# Patient Record
Sex: Male | Born: 1980 | Race: White | Hispanic: No | Marital: Single | Smoking: Former smoker
Health system: Southern US, Community
[De-identification: ages and names within clinical notes are randomized; demographics above are authoritative.]

## PROBLEM LIST (undated history)

## (undated) DIAGNOSIS — I340 Nonrheumatic mitral (valve) insufficiency: Secondary | ICD-10-CM

## (undated) DIAGNOSIS — I38 Endocarditis, valve unspecified: Secondary | ICD-10-CM

## (undated) DIAGNOSIS — Z209 Contact with and (suspected) exposure to unspecified communicable disease: Secondary | ICD-10-CM

## (undated) DIAGNOSIS — R011 Cardiac murmur, unspecified: Secondary | ICD-10-CM

## (undated) DIAGNOSIS — R0609 Other forms of dyspnea: Secondary | ICD-10-CM

## (undated) DIAGNOSIS — I351 Nonrheumatic aortic (valve) insufficiency: Secondary | ICD-10-CM

## (undated) HISTORY — PX: HERNIA REPAIR: SHX51

---

## 2012-05-21 ENCOUNTER — Encounter (HOSPITAL_COMMUNITY): Payer: Self-pay | Admitting: Emergency Medicine

## 2012-05-21 ENCOUNTER — Emergency Department (HOSPITAL_COMMUNITY)
Admission: EM | Admit: 2012-05-21 | Discharge: 2012-05-22 | Disposition: A | Discharge status: Home / Self Care | Payer: BC Managed Care – PPO | Attending: Emergency Medicine | Admitting: Emergency Medicine

## 2012-05-21 DIAGNOSIS — Z203 Contact with and (suspected) exposure to rabies: Secondary | ICD-10-CM

## 2012-05-21 MED ORDER — RABIES VACCINE, PCEC IM SUSR
1.0000 mL | Freq: Once | INTRAMUSCULAR | Status: AC
  Administered 2012-05-22: 1 mL via INTRAMUSCULAR
  Filled 2012-05-21: qty 1

## 2012-05-21 MED ORDER — RABIES IMMUNE GLOBULIN 150 UNIT/ML IM INJ
20.0000 [IU]/kg | INJECTION | Freq: Once | INTRAMUSCULAR | Status: AC
  Administered 2012-05-22: 1500 [IU] via INTRAMUSCULAR
  Filled 2012-05-21: qty 10

## 2012-05-21 NOTE — ED Notes (Signed)
PT. REPORTS FOUND A BAT INSIDE HIS ROOM , UNSURE IF HE IS BITTEN . NO OTHER COMPLAINTS OR SYMPTOMS.

## 2012-05-21 NOTE — ED Provider Notes (Signed)
History     CSN: [id number]  Arrival date & time 05/21/12  2204   None     No chief complaint on file.   (Consider location/radiation/quality/duration/timing/severity/associated sxs/prior treatment) HPI Comments: Last night, while sleeping he thought he was having a dream and kicked about off his foot and [place] in the corner of his room, went back to sleep, upon waking in the morning.  He found a bat in his shower.  He skipped it up with a dust pan and throughout the window, stating it looked sick  The history is provided by the patient.    History reviewed. No pertinent past medical history.  History reviewed. No pertinent past surgical history.  No family history on file.  History  Substance Use Topics  . Smoking status: Not on file  . Smokeless tobacco: Not on file  . Alcohol Use: Not on file      Review of Systems  Constitutional: Negative for fever.  Neurological: Negative for dizziness and weakness.  Psychiatric/Behavioral: The patient is nervous/anxious.     Allergies  Review of patient's allergies indicates no known allergies.  Home Medications  No current outpatient prescriptions on file.  BP 133/49  Pulse 92  Temp 98.4 F (36.9 C) (Oral)  Resp 14  Ht 5\' 6"  (1.676 m)  Wt 167 lb 12.8 oz (76.114 kg)  BMI 27.08 kg/m2  SpO2 98%  Physical Exam  Constitutional: He appears well-developed and well-nourished.  HENT:  Head: Normocephalic.  Neck: Normal range of motion.  Cardiovascular: Normal rate.   Pulmonary/Chest: Effort normal.  Skin:       Noted in the skin noted    ED Course  Procedures (including critical care time)  Labs Reviewed - No data to display No results found.   1. Rabies exposure       MDM   Will start rabies series        Arman Filter, NP 05/22/12 0042  Arman Filter, NP 05/22/12 [phone]

## 2012-05-22 NOTE — Discharge Instructions (Signed)
Rabies   You may have been exposed to rabies. It can be carried by skunks, bats, woodchucks, raccoons and foxes. It is less common in dogs; however this is one of the most common bites for which the rabies vaccine is given. When bitten by an infected animal, a person gets rabies from the infected saliva (spit) of the animal. Most people get sick 20 to 90 days after being bitten. This varies based on the location of the bite. The rabies virus affects the brain so the closer to the head the bite occurs, the less time it will take the illness to develop. Once rabies develops it almost always causes death. Because of this, it is often necessary to start treatment whether it is known if the animal is healthy or not.  If bitten by an animal and the animal can be observed to see if it remains healthy, often no further treatment is necessary other than care of the wounds caused by the animal. If the animal has been killed it can be sent into a state laboratory and the brain can be examined to see if the animal had rabies.  TREATMENT   There is no known treatment for rabies once the disease starts. This is a viral illness and antibiotics (medications which kill germs) do not help. This is why caregivers use extra caution and treat questionable bites with rabies vaccine to prevent the disease.  RABIES IMMUNIZATION SCHEDULE - POST-EXPOSURE  Be sure to record the dates of your injections for your records.  1st Injection Date________________________  Day 3__________________________________  Day 7__________________________________  Day 14_________________________________  HOME CARE INSTRUCTIONS   If you have received a bite from an unknown animal, make sure you know the instructions for follow up. If the animal was sent to a laboratory for examination, make sure you know how you are to obtain your results.   Keep wound clean, dry and dressed as suggested by your caregiver.   Take antibiotics as directed and finish the  prescription, even if the wound appears OK.   Keep injured part elevated as much as possible.   Do not resume use of the affected area until directed.   Only take over-the-counter or prescription medicines for pain, discomfort, or fever as directed by your caregiver.  SEEK IMMEDIATE MEDICAL CARE IF:    You have a fever.   There is nausea (feeling sick to your stomach), vomiting, chills or a high temperature.   There is unusual behavior including hyperactivity, fear of water (hydrophobia), or fear of air (aerophobia).   If pain prevents movement of any joint.   If you are unable to move a finger or toe.   The wound becomes more inflamed and swollen, or has a purulent (pus-like) discharge.   You notice that there is a foreign substance, such as cloth or a tooth, in the wound.   If a red line develops at the site of the wound and begins to move up the arm or leg.  Document Released: 11/10/2005 Document Revised: 10/30/2011 Document Reviewed: 02/15/2009  Exit[employer] Patient Information 2012 [employer].

## 2012-05-22 NOTE — ED Provider Notes (Signed)
Medical screening examination/treatment/procedure(s) were performed by non-physician practitioner and as supervising physician I was immediately available for consultation/collaboration.   Offie Waide, MD 05/22/12 0706 

## 2012-05-24 ENCOUNTER — Encounter (HOSPITAL_COMMUNITY): Payer: Self-pay | Admitting: *Deleted

## 2012-05-24 ENCOUNTER — Telehealth (HOSPITAL_COMMUNITY): Payer: Self-pay | Admitting: *Deleted

## 2012-05-24 ENCOUNTER — Emergency Department (INDEPENDENT_AMBULATORY_CARE_PROVIDER_SITE_OTHER)
Admission: EM | Admit: 2012-05-24 | Discharge: 2012-05-24 | Disposition: A | Discharge status: Home / Self Care | Payer: BC Managed Care – PPO | Source: Home / Self Care

## 2012-05-24 DIAGNOSIS — Z23 Encounter for immunization: Secondary | ICD-10-CM

## 2012-05-24 MED ORDER — RABIES VACCINE, PCEC IM SUSR
INTRAMUSCULAR | Status: AC
  Filled 2012-05-24: qty 1

## 2012-05-24 MED ORDER — RABIES VACCINE, PCEC IM SUSR
1.0000 mL | Freq: Once | INTRAMUSCULAR | Status: AC
  Administered 2012-05-24: 1 mL via INTRAMUSCULAR

## 2012-05-24 NOTE — ED Notes (Signed)
Here for 2nd rabies vaccine for bat exposure.  No previous problems with injections.

## 2012-05-24 NOTE — ED Notes (Signed)
Rabies schedule completed and faxed to [facility] pharmacy x 2.

## 2012-05-24 NOTE — ED Notes (Signed)
Rabies vaccines scheduled for today @ 1630, 7/5 @ 1230 and 7/12 @ 1530.

## 2012-05-24 NOTE — Discharge Instructions (Signed)
Call if any problems.  Return 7/5 @ 1230 for next vaccine.

## 2012-05-24 NOTE — ED Notes (Signed)
Here for second rabies vaccine for bat exposure.

## 2012-05-24 NOTE — ED Notes (Signed)
Call from pt.'s personal assistant to schedule his rabies vaccines for bat exposure.  He is supposed to come today, 7/5 and 7/12. I offered times for him to come he said he would check with him and call me back. Vassie Moselle 05/24/2012

## 2012-05-28 ENCOUNTER — Encounter (HOSPITAL_COMMUNITY): Payer: Self-pay | Admitting: *Deleted

## 2012-05-28 ENCOUNTER — Emergency Department (INDEPENDENT_AMBULATORY_CARE_PROVIDER_SITE_OTHER)
Admission: EM | Admit: 2012-05-28 | Discharge: 2012-05-28 | Disposition: A | Discharge status: Home / Self Care | Payer: BC Managed Care – PPO | Source: Home / Self Care

## 2012-05-28 DIAGNOSIS — Z23 Encounter for immunization: Secondary | ICD-10-CM

## 2012-05-28 MED ORDER — RABIES VACCINE, PCEC IM SUSR
1.0000 mL | Freq: Once | INTRAMUSCULAR | Status: AC
  Administered 2012-05-28: 1 mL via INTRAMUSCULAR

## 2012-05-28 MED ORDER — RABIES VACCINE, PCEC IM SUSR
INTRAMUSCULAR | Status: AC
  Filled 2012-05-28: qty 1

## 2012-05-28 NOTE — ED Notes (Signed)
Here for third rabies vaccine for bat exposure.  C/o fever and fatigue. He asked if it was from the rabies shots. I told him it might be.

## 2012-05-31 ENCOUNTER — Telehealth (HOSPITAL_COMMUNITY): Payer: Self-pay | Admitting: *Deleted

## 2012-05-31 NOTE — ED Notes (Signed)
Pt. called and said he is still having a fever and has been taking Tylenol every 6 hrs. I did not think side effects from rabies shot would last over 24-48 hrs, but told him it may not be related to the rabies shot he received on Fri.  C/o numbness and tingling in his arms and fingers intermitantly.  Also c/o bruise on the pad of his long finger with swelling and pain- no known injury.  Discussed with Dr. Chaney Malling and she said he needs to come and be checked.  Can't make a diagnosis with symptoms given over the phone. Pt. told this. He said he will watch it tonight and come in AM if no improvement. Vassie Moselle 05/31/2012

## 2012-06-04 ENCOUNTER — Emergency Department (INDEPENDENT_AMBULATORY_CARE_PROVIDER_SITE_OTHER)
Admission: EM | Admit: 2012-06-04 | Discharge: 2012-06-04 | Disposition: A | Discharge status: Home / Self Care | Payer: BC Managed Care – PPO | Source: Home / Self Care

## 2012-06-04 ENCOUNTER — Encounter (HOSPITAL_COMMUNITY): Payer: Self-pay | Admitting: *Deleted

## 2012-06-04 DIAGNOSIS — Z23 Encounter for immunization: Secondary | ICD-10-CM

## 2012-06-04 MED ORDER — RABIES VACCINE, PCEC IM SUSR
1.0000 mL | Freq: Once | INTRAMUSCULAR | Status: AC
  Administered 2012-06-04: 1 mL via INTRAMUSCULAR

## 2012-06-04 NOTE — ED Notes (Signed)
Here for last rabies vaccine for bat exposure.  Had fever for 48 hrs after last vaccination.

## 2012-06-08 ENCOUNTER — Ambulatory Visit
Admission: RE | Admit: 2012-06-08 | Discharge: 2012-06-08 | Disposition: A | Discharge status: Home / Self Care | Payer: BC Managed Care – PPO | Source: Ambulatory Visit | Attending: Physician Assistant | Admitting: Physician Assistant

## 2012-06-08 ENCOUNTER — Other Ambulatory Visit: Payer: Self-pay | Admitting: Physician Assistant

## 2012-06-08 DIAGNOSIS — R05 Cough: Secondary | ICD-10-CM

## 2012-06-08 DIAGNOSIS — R059 Cough, unspecified: Secondary | ICD-10-CM

## 2012-06-23 ENCOUNTER — Other Ambulatory Visit: Payer: Self-pay | Admitting: Interventional Cardiology

## 2012-06-24 ENCOUNTER — Inpatient Hospital Stay (HOSPITAL_COMMUNITY)
Admission: AD | Admit: 2012-06-24 | Discharge: 2012-06-29 | DRG: 126 | Payer: BC Managed Care – PPO | Source: Ambulatory Visit | Attending: Family Medicine | Admitting: Family Medicine

## 2012-06-24 ENCOUNTER — Ambulatory Visit (INDEPENDENT_AMBULATORY_CARE_PROVIDER_SITE_OTHER): Payer: BC Managed Care – PPO | Admitting: Internal Medicine

## 2012-06-24 ENCOUNTER — Encounter: Payer: Self-pay | Admitting: *Deleted

## 2012-06-24 ENCOUNTER — Encounter: Payer: Self-pay | Admitting: Internal Medicine

## 2012-06-24 ENCOUNTER — Encounter (HOSPITAL_COMMUNITY): Payer: Self-pay | Admitting: Internal Medicine

## 2012-06-24 VITALS — BP 131/63 | HR 114 | Temp 98.5°F | Wt 159.0 lb

## 2012-06-24 DIAGNOSIS — R0609 Other forms of dyspnea: Secondary | ICD-10-CM

## 2012-06-24 DIAGNOSIS — B952 Enterococcus as the cause of diseases classified elsewhere: Secondary | ICD-10-CM | POA: Diagnosis present

## 2012-06-24 DIAGNOSIS — Z87891 Personal history of nicotine dependence: Secondary | ICD-10-CM

## 2012-06-24 DIAGNOSIS — I38 Endocarditis, valve unspecified: Secondary | ICD-10-CM

## 2012-06-24 DIAGNOSIS — E8809 Other disorders of plasma-protein metabolism, not elsewhere classified: Secondary | ICD-10-CM | POA: Diagnosis present

## 2012-06-24 DIAGNOSIS — Z8249 Family history of ischemic heart disease and other diseases of the circulatory system: Secondary | ICD-10-CM

## 2012-06-24 DIAGNOSIS — R011 Cardiac murmur, unspecified: Secondary | ICD-10-CM

## 2012-06-24 DIAGNOSIS — I340 Nonrheumatic mitral (valve) insufficiency: Secondary | ICD-10-CM

## 2012-06-24 DIAGNOSIS — Q231 Congenital insufficiency of aortic valve: Secondary | ICD-10-CM

## 2012-06-24 DIAGNOSIS — R0602 Shortness of breath: Secondary | ICD-10-CM

## 2012-06-24 DIAGNOSIS — R000 Tachycardia, unspecified: Secondary | ICD-10-CM | POA: Diagnosis present

## 2012-06-24 DIAGNOSIS — E8779 Other fluid overload: Secondary | ICD-10-CM | POA: Diagnosis present

## 2012-06-24 DIAGNOSIS — I509 Heart failure, unspecified: Secondary | ICD-10-CM | POA: Diagnosis present

## 2012-06-24 DIAGNOSIS — I351 Nonrheumatic aortic (valve) insufficiency: Secondary | ICD-10-CM

## 2012-06-24 DIAGNOSIS — B955 Unspecified streptococcus as the cause of diseases classified elsewhere: Secondary | ICD-10-CM

## 2012-06-24 DIAGNOSIS — I5021 Acute systolic (congestive) heart failure: Secondary | ICD-10-CM | POA: Diagnosis present

## 2012-06-24 DIAGNOSIS — I330 Acute and subacute infective endocarditis: Principal | ICD-10-CM | POA: Diagnosis present

## 2012-06-24 DIAGNOSIS — R0600 Dyspnea, unspecified: Secondary | ICD-10-CM

## 2012-06-24 HISTORY — DX: Other forms of dyspnea: R06.09

## 2012-06-24 HISTORY — DX: Nonrheumatic mitral (valve) insufficiency: I34.0

## 2012-06-24 HISTORY — DX: Dyspnea, unspecified: R06.00

## 2012-06-24 HISTORY — DX: Endocarditis, valve unspecified: I38

## 2012-06-24 HISTORY — DX: Contact with and (suspected) exposure to unspecified communicable disease: Z20.9

## 2012-06-24 HISTORY — DX: Nonrheumatic aortic (valve) insufficiency: I35.1

## 2012-06-24 HISTORY — DX: Cardiac murmur, unspecified: R01.1

## 2012-06-24 LAB — CBC WITH DIFFERENTIAL/PLATELET
Basophils Absolute: 0.1 10*3/uL (ref 0.0–0.1)
Basophils Relative: 1 % (ref 0–1)
Eosinophils Absolute: 0.1 10*3/uL (ref 0.0–0.7)
Eosinophils Relative: 1 % (ref 0–5)
HCT: 33.6 % — ABNORMAL LOW (ref 39.0–52.0)
Hemoglobin: 10.9 g/dL — ABNORMAL LOW (ref 13.0–17.0)
Lymphocytes Relative: 14 % (ref 12–46)
Lymphs Abs: 1.3 10*3/uL (ref 0.7–4.0)
MCH: 26.2 pg (ref 26.0–34.0)
MCHC: 32.4 g/dL (ref 30.0–36.0)
MCV: 80.8 fL (ref 78.0–100.0)
Monocytes Absolute: 0.7 10*3/uL (ref 0.1–1.0)
Monocytes Relative: 8 % (ref 3–12)
Neutro Abs: 7.2 10*3/uL (ref 1.7–7.7)
Neutrophils Relative %: 76 % (ref 43–77)
Platelets: 434 10*3/uL — ABNORMAL HIGH (ref 150–400)
RBC: 4.16 MIL/uL — ABNORMAL LOW (ref 4.22–5.81)
RDW: 13.7 % (ref 11.5–15.5)
WBC: 9.4 10*3/uL (ref 4.0–10.5)

## 2012-06-24 MED ORDER — ACETAMINOPHEN 325 MG PO TABS
650.0000 mg | ORAL_TABLET | Freq: Four times a day (QID) | ORAL | Status: DC | PRN
Start: 2012-06-24 — End: 2012-06-29

## 2012-06-24 MED ORDER — FUROSEMIDE 20 MG PO TABS
20.0000 mg | ORAL_TABLET | Freq: Every day | ORAL | Status: DC
  Administered 2012-06-25: 20 mg via ORAL
  Filled 2012-06-24: qty 1

## 2012-06-24 MED ORDER — SODIUM CHLORIDE 0.9 % IJ SOLN
3.0000 mL | Freq: Two times a day (BID) | INTRAMUSCULAR | Status: DC
  Administered 2012-06-25 – 2012-06-28 (×8): 3 mL via INTRAVENOUS

## 2012-06-24 MED ORDER — ACETAMINOPHEN 650 MG RE SUPP: 650.0000 mg | Freq: Four times a day (QID) | RECTAL | Status: DC | PRN

## 2012-06-24 MED ORDER — SODIUM CHLORIDE 0.9 % IJ SOLN: 3.0000 mL | Freq: Two times a day (BID) | INTRAMUSCULAR | Status: DC

## 2012-06-24 MED ORDER — ONDANSETRON HCL 4 MG PO TABS: 4.0000 mg | ORAL_TABLET | Freq: Four times a day (QID) | ORAL | Status: DC | PRN

## 2012-06-24 MED ORDER — SODIUM CHLORIDE 0.9 % IV SOLN
2.0000 g | INTRAVENOUS | Status: DC
  Administered 2012-06-24 – 2012-06-25 (×4): 2 g via INTRAVENOUS
  Filled 2012-06-24 (×9): qty 2000

## 2012-06-24 MED ORDER — SODIUM CHLORIDE 0.9 % IJ SOLN
3.0000 mL | Freq: Two times a day (BID) | INTRAMUSCULAR | Status: DC
  Administered 2012-06-24: 3 mL via INTRAVENOUS

## 2012-06-24 MED ORDER — VANCOMYCIN HCL IN DEXTROSE 1-5 GM/200ML-% IV SOLN
1000.0000 mg | Freq: Three times a day (TID) | INTRAVENOUS | Status: DC
  Administered 2012-06-25 – 2012-06-26 (×5): 1000 mg via INTRAVENOUS
  Filled 2012-06-24 (×6): qty 200

## 2012-06-24 MED ORDER — SODIUM CHLORIDE 0.45 % IV SOLN: INTRAVENOUS | Status: DC

## 2012-06-24 MED ORDER — ONDANSETRON HCL 4 MG/2ML IJ SOLN: 4.0000 mg | Freq: Four times a day (QID) | INTRAMUSCULAR | Status: DC | PRN

## 2012-06-24 MED ORDER — GENTAMICIN SULFATE 40 MG/ML IJ SOLN
70.0000 mg | Freq: Three times a day (TID) | INTRAVENOUS | Status: DC
  Administered 2012-06-25 (×2): 70 mg via INTRAVENOUS
  Filled 2012-06-24 (×3): qty 1.75

## 2012-06-24 MED ORDER — SODIUM CHLORIDE 0.9 % IJ SOLN: 3.0000 mL | INTRAMUSCULAR | Status: DC | PRN

## 2012-06-24 MED ORDER — SODIUM CHLORIDE 0.9 % IV SOLN: 250.0000 mL | INTRAVENOUS | Status: DC | PRN

## 2012-06-24 NOTE — Progress Notes (Signed)
ANTIBIOTIC CONSULT NOTE - INITIAL  Pharmacy Consult for vancomycin and gentamicin  Indication: native valve Enterococcus endocarditis  No Known Allergies  Patient Measurements: Height: 5\' 6"  (167.6 cm) Weight: 159 lb (72.122 kg) IBW/kg (Calculated) : 63.8   Vital Signs: Temp: 98.3 F (36.8 C) (08/01 2100) Temp src: Oral (08/01 2100) BP: 128/56 mmHg (08/01 2100) Pulse Rate: 107  (08/01 2100) Intake/Output from previous day:   Intake/Output from this shift:    Labs:  Basename 06/24/12 1524  WBC 9.4  HGB 10.9*  PLT 434*  LABCREA --  CREATININE --   CrCl is unknown because no creatinine reading has been taken. No results found for this basename: VANCOTROUGH:2,VANCOPEAK:2,VANCORANDOM:2,GENTTROUGH:2,GENTPEAK:2,GENTRANDOM:2,TOBRATROUGH:2,TOBRAPEAK:2,TOBRARND:2,AMIKACINPEAK:2,AMIKACINTROU:2,AMIKACIN:2, in the last 72 hours   Microbiology: No results found for this or any previous visit (from the past 720 hour(s)).  Medical History: Past Medical History  Diagnosis Date  . No pertinent past medical history     Medications:  Prescriptions prior to admission  Medication Sig Dispense Refill  . beclomethasone (QVAR) 40 MCG/ACT inhaler Inhale 1 puff into the lungs 2 (two) times daily.      . furosemide (LASIX) 20 MG tablet Take 20 mg by mouth daily.       Assessment: 31 yo male with new onset heart murmur at outpatient office visit. TTE showed bicupsid AV but mild to mod MR, severe AR and a defect in the noncoronary cusp of AV concerning for tear. Blood cultures are positive for Enterococcus, sensitivities pending. Pharmacy consulted to begin vancomycin and gentamicin for native valve endocarditis. ID consulted and recommends 6 weeks of antibiotics. Baseline labs pending.  Goal of Therapy:  Vancomycin trough level 15-20 mcg/ml  Plan:  -Vancomycin 1000 mg IV q8h -Gentamicin 70 mg IV q8h -Follow up culture data and clinical progress -Follow-up baseline labs and adjust  antibiotics if needed   [facility] [place].D., BCPS Clinical Pharmacist Pager: [phone] 06/24/2012 10:54 PM

## 2012-06-24 NOTE — H&P (Signed)
Austin Charles is an 31 y.o. male.   Patient was seen and examined on June 24, 2012 at 10:30 PM. PCP - Dr. Daleen Squibb of [facility]. Cardiologist - Dr. Eldridge Dace. Please see Dr. Drue Second, infectious disease consultants note dictated today. Chief Complaint: Positive blood cultures. HPI: 31 year old male with no significant past medical history was experiencing chronic cough for last 6 months since he had a flulike illness in February this year. At that time he also had deep cleaning of his teeth. He recent bat exposure and had undergone complete series of rabies vaccination recently in July 2013. During dose injections he used to get fever and chills for 2 days after each dose of injection. Patient since his cough is persistent he had gone to an urgent care at [facility]. There he was prescribed steroid inhaler. Despite taking which his cough did not improve and also had shortness of breath which progressed and worsened and his cough had blood-tinged sputum. At this point he was referred to cardiologist Dr. Eldridge Dace. On physical exam he was found to have abnormal heart murmur and S3. Patient had 2-D echo done which showed bicuspid aortic valve and severe aortic regurgitation. Patient had blood cultures drawn at that time. Cultures grew enterococcus and was referred to infectious disease consult. Patient by the time the time short of breath even at rest. At this time patient has been referred to hospitalist for further workup for his infective endocarditis by the infectious disease consultant Dr. Drue Second. Patient denies any chest pain or nausea vomiting headache visual symptoms abdominal pain or any joint swellings. Patient presently is not in acute distress. He did experience some painful swelling of his left middle and ring finger. Which improved and resolved this happened last week. Yesterday he did develop some painful fingers in the right index finger which also has resolved.  Past  Medical History  Diagnosis Date  . No pertinent past medical history     Past Surgical History  Procedure Date  . No past surgeries     Family History  Problem Relation Age of Onset  . Coronary artery disease Other    Social History:  reports that he has never smoked. He does not have any smokeless tobacco history on file. He reports that he drinks alcohol. He reports that he does not use illicit drugs.  Allergies: No Known Allergies  Medications Prior to Admission  Medication Sig Dispense Refill  . beclomethasone (QVAR) 40 MCG/ACT inhaler Inhale 1 puff into the lungs 2 (two) times daily.      . furosemide (LASIX) 20 MG tablet Take 20 mg by mouth daily.        No results found for this or any previous visit (from the past 48 hour(s)). No results found.  Review of Systems  Constitutional: Positive for fever and chills.  HENT: Negative.   Eyes: Negative.   Respiratory: Positive for shortness of breath.   Cardiovascular: Negative.   Gastrointestinal: Negative.   Genitourinary: Negative.   Musculoskeletal:       Pain in the finger tips.  Skin: Negative.   Neurological: Negative.   Endo/Heme/Allergies: Negative.   Psychiatric/Behavioral: Negative.     Blood pressure 128/56, pulse 107, temperature 98.3 F (36.8 C), temperature source Oral, height 5\' 6"  (1.676 m), weight 72.122 kg (159 lb), SpO2 99.00%. Physical Exam  Constitutional: He is oriented to person, place, and time. He appears well-developed and well-nourished. No distress.  HENT:  Head: Atraumatic.  Right Ear: External  ear normal.  Left Ear: External ear normal.  Nose: Nose normal.  Mouth/Throat: Oropharynx is clear and moist. No oropharyngeal exudate.  Eyes: Conjunctivae are normal. Pupils are equal, round, and reactive to light. Right eye exhibits no discharge. Left eye exhibits no discharge. No scleral icterus.  Neck: Normal range of motion. Neck supple.  Cardiovascular: Regular rhythm.   Murmur heard.       Tachycardia.  Respiratory: Effort normal and breath sounds normal. No respiratory distress. He has no wheezes. He has no rales.  GI: Soft. Bowel sounds are normal.  Musculoskeletal: Normal range of motion. He exhibits no edema and no tenderness.  Neurological: He is alert and oriented to person, place, and time.       Moves all extremities.  Skin: Skin is warm and dry. He is not diaphoretic.  Psychiatric: His behavior is normal.     Assessment/Plan #1. 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 Stanford Breed recommendations patient has been started on vancomycin gentamicin and ampicillin to cover for endocarditis. Repeat blood cultures has been ordered. I also discussed with on-call cardiologist Dr. Shirlee Latch. At this time we will be continuing his Lasix as prescribed by Dr. Eldridge Dace earlier. Patient is to get a TEE tomorrow. Patient does have sinus tachycardia at this time closely monitor in telemetry. We'll need to notify Dr. Eldridge Dace in a.m. Further recommendations per infectious disease and cardiology.  CODE STATUS - full code.  Austin Charles N. 06/24/2012, 10:30 PM

## 2012-06-24 NOTE — Progress Notes (Signed)
INFECTIOUS DISEASES CLINIC  RFV: community referral for native valve endocarditis  Subjective:    Patient ID: Austin Charles, male    DOB: June 04, 1981, 31 y.o.   MRN: [id number]  HPI Austin Charles is a 31yo Male with no significant past medical history, no hx of childhood murmurs or congenital heart disease. He reports having a flu like illness in Feb 2013 with fever, sore throat, cough, malaise that lasted roughly 1 week but states he has had a persistent dry cough since then. He recalls having some dental work around this same time period, where he had a deep cleaning in feb/march 2013.  Roughly 1 month ago, he had a bat exposure at his home, unclear if he had bite/scratch but did seek immediate medical attention in the ED where he underwent rabies vaccination series. He states that he would get fevers for 2 days after each injection. Over the past 4 wk, he is having increasing dyspnea on exertion, lethargy, but no orthopnea. He states he has had occassional nightsweats and fatigue but no overt fevers or rigors. He recalls having episodes of spot tenderness to finger pad of his 2nd or 3rd digit, which he is not sure if this is related. He states his fatigue is slightly improved but has had 5-7 lb weight loss from his baseline.  In regards to his chronic cough, he had received an inhaler from urgent care but the noticed having blood tinged sputum. He was seen by Dr. Eldridge Dace at [facility] cardiology who noted a new 2/6 murmur with an S3. EKG some signs of LVH. TTE showed bicupsid AV but mild to mod MR, severe AR nad a defect in the noncornary cusp of AV concerning for tear. He drew a set of blood cultures that recently identified GPCs. Thus patient referred to ID clinic, and patient to have TEE tomorrow on 8/2  Social hx: He denies any injection drug use, no cat scratches or exposure to animals. He has not had any recent travel. Did go to [place] 2 yrs ago, tourist, stayed in hotels, site-seeing mostly. He is a  Energy manager, doing dissertation.  Stopped smoking 1 yr ago. Rare etoh use. He lives in an older home, bottom floor. He is concerned about mold, but does not see evidence of it in kitchen or bathroom. 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 His family is in [place] where he hopes to travel to next week for roughly 7-10days.  Family hx: hyperlipidemia  Current Outpatient Prescriptions on File Prior to Visit  Medication Sig Dispense Refill  . beclomethasone (QVAR) 40 MCG/ACT inhaler Inhale 1 puff into the lungs 2 (two) times daily.       No Known Allergies Active Ambulatory Problems    Diagnosis Date Noted  . Undiagnosed cardiac murmurs 06/24/2012  . Shortness of breath 06/24/2012  . Endocarditis 06/24/2012   Resolved Ambulatory Problems    Diagnosis Date Noted  . No Resolved Ambulatory Problems   No Additional Past Medical History   Review of Systems 10 point review of system reviewed, positive pertinents in hpi. No n/v/d/rash/headache/arthralgias     Objective:   Physical Exam BP 131/63  Pulse 114  Temp 98.5 F (36.9 C) (Oral)  Wt 159 lb (72.122 kg)  General Appearance:    Alert, cooperative, no distress, appears stated age  Head:    Normocephalic, without obvious abnormality, atraumatic  Eyes:    PERRL, conjunctiva/corneas clear, EOM's intact, fundi    benign, both eyes  Ears:    Normal TM's and external ear canals, both ears  Nose:   Nares normal, septum midline, mucosa normal, no drainage   or sinus tenderness  Throat:   Lips, mucosa, and tongue normal; teeth and gums normal  Neck:   Supple, symmetrical, trachea midline, no adenopathy;         Back:     Symmetric, no curvature, ROM normal, no CVA tenderness  Lungs:     Clear to auscultation bilaterally, respirations unlabored     Heart:    Tachycardic, Regular rhythm, S1 and S2 normal, plus S3, 2 of 6 SEM BH at apex. no  rubor gallop  Abdomen:     Soft, non-tender, bowel sounds active all four  quadrants,    no masses, no organomegaly        Extremities:   Mild clubbing of nails (pt states that is his baseline), no janeway, oslers or splinter hemorrhages  Pulses:   2+ and symmetric all extremities  Skin:   Skin color, texture, turgor normal, no rashes or lesions  Lymph nodes:   Cervical, supraclavicular, and axillary nodes normal  Neurologic:   CNII-XII intact. Normal strength, sensation and reflexes      throughout   Historic micro: We called labcrop and blood cx -> enterococcus species. Susceptibilities are pending      Assessment & Plan:  31yo Male with new onset heart murmur as noted on physical exam, documented on TTE, and blood cultures positive for enterococcus. 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 He meets 2 major [facility] criteria, and possibly 1 minor with osler's nodes ( by history)  - will get cbc with diff, cmp, and repeat blood cultures to document if persistent bacteremia - will need to arrange for IV antibiotics, and have picc line placed once bacteremia has been cleared. - looking forward to hearing about TEE results tomorrow to see if any vegetations  - still recommend that the patient be also evaluated by CT surgery to see if needs valve replacement - await for identification of enterococcus species ( if e.faecalis (amp S) or vanco resistant e. Faecium)  - will need 6 wks of IV antibiotics ( if amp S enterococcus -> ampicillin 2gm Q4 hr IV plus gentamicin 1mg /kg/Q8hr ) but regimen may change depending on enterococcus susceptibility.  - will discuss recs with Dr. Eldridge Dace  to decide how to proceed with hospital admission.  [facility] Salvia Drue Second MD MPH [facility] [phone]

## 2012-06-25 ENCOUNTER — Encounter (HOSPITAL_COMMUNITY)
Admission: AD | Disposition: A | Discharge status: Other Acute Inpatient Hospital | Payer: Self-pay | Source: Ambulatory Visit | Attending: Family Medicine

## 2012-06-25 ENCOUNTER — Encounter (HOSPITAL_COMMUNITY): Payer: Self-pay | Admitting: *Deleted

## 2012-06-25 ENCOUNTER — Inpatient Hospital Stay (HOSPITAL_COMMUNITY): Payer: BC Managed Care – PPO

## 2012-06-25 DIAGNOSIS — I359 Nonrheumatic aortic valve disorder, unspecified: Secondary | ICD-10-CM

## 2012-06-25 DIAGNOSIS — I330 Acute and subacute infective endocarditis: Secondary | ICD-10-CM

## 2012-06-25 DIAGNOSIS — I351 Nonrheumatic aortic (valve) insufficiency: Secondary | ICD-10-CM | POA: Diagnosis present

## 2012-06-25 DIAGNOSIS — R011 Cardiac murmur, unspecified: Secondary | ICD-10-CM

## 2012-06-25 HISTORY — PX: TEE WITHOUT CARDIOVERSION: SHX5443

## 2012-06-25 LAB — COMPREHENSIVE METABOLIC PANEL
ALT: 32 U/L (ref 0–53)
ALT: 36 U/L (ref 0–53)
ALT: 37 U/L (ref 0–53)
AST: 27 U/L (ref 0–37)
Albumin: 2.6 g/dL — ABNORMAL LOW (ref 3.5–5.2)
Albumin: 2.4 g/dL — ABNORMAL LOW (ref 3.5–5.2)
Alkaline Phosphatase: 85 U/L (ref 39–117)
Alkaline Phosphatase: 92 U/L (ref 39–117)
BUN: 10 mg/dL (ref 6–23)
Calcium: 8.3 mg/dL — ABNORMAL LOW (ref 8.4–10.5)
Chloride: 101 mEq/L (ref 96–112)
Glucose, Bld: 119 mg/dL — ABNORMAL HIGH (ref 70–99)
Potassium: 3.9 mEq/L (ref 3.5–5.1)
Potassium: 4 mEq/L (ref 3.5–5.1)
Sodium: 136 mEq/L (ref 135–145)
Sodium: 137 mEq/L (ref 135–145)
Sodium: 137 mEq/L (ref 135–145)
Total Bilirubin: 0.5 mg/dL (ref 0.3–1.2)
Total Bilirubin: 0.7 mg/dL (ref 0.3–1.2)
Total Protein: 5.9 g/dL — ABNORMAL LOW (ref 6.0–8.3)
Total Protein: 6.2 g/dL (ref 6.0–8.3)
Total Protein: 6.2 g/dL (ref 6.0–8.3)

## 2012-06-25 LAB — CBC WITH DIFFERENTIAL/PLATELET
Basophils Absolute: 0 10*3/uL (ref 0.0–0.1)
Basophils Relative: 0 % (ref 0–1)
Basophils Relative: 0 % (ref 0–1)
Eosinophils Absolute: 0.1 10*3/uL (ref 0.0–0.7)
Eosinophils Relative: 1 % (ref 0–5)
Eosinophils Relative: 1 % (ref 0–5)
HCT: 31 % — ABNORMAL LOW (ref 39.0–52.0)
HCT: 32.4 % — ABNORMAL LOW (ref 39.0–52.0)
Hemoglobin: 10.1 g/dL — ABNORMAL LOW (ref 13.0–17.0)
MCH: 26.5 pg (ref 26.0–34.0)
MCHC: 32.6 g/dL (ref 30.0–36.0)
MCHC: 33 g/dL (ref 30.0–36.0)
MCV: 81.4 fL (ref 78.0–100.0)
MCV: 82 fL (ref 78.0–100.0)
Monocytes Absolute: 0.5 10*3/uL (ref 0.1–1.0)
Monocytes Absolute: 0.6 10*3/uL (ref 0.1–1.0)
Monocytes Relative: 6 % (ref 3–12)
Neutro Abs: 7.5 10*3/uL (ref 1.7–7.7)
Neutro Abs: 7.9 10*3/uL — ABNORMAL HIGH (ref 1.7–7.7)
Platelets: 358 10*3/uL (ref 150–400)
RDW: 13.2 % (ref 11.5–15.5)
WBC: 9.1 10*3/uL (ref 4.0–10.5)

## 2012-06-25 LAB — CARDIAC PANEL(CRET KIN+CKTOT+MB+TROPI)
Relative Index: INVALID (ref 0.0–2.5)
Total CK: 39 U/L (ref 7–232)

## 2012-06-25 LAB — GLUCOSE, CAPILLARY
Glucose-Capillary: 112 mg/dL — ABNORMAL HIGH (ref 70–99)
Glucose-Capillary: 114 mg/dL — ABNORMAL HIGH (ref 70–99)

## 2012-06-25 SURGERY — ECHOCARDIOGRAM, TRANSESOPHAGEAL
Anesthesia: Moderate Sedation

## 2012-06-25 MED ORDER — FENTANYL CITRATE 0.05 MG/ML IJ SOLN
INTRAMUSCULAR | Status: AC
  Filled 2012-06-25: qty 4

## 2012-06-25 MED ORDER — SODIUM CHLORIDE 0.9 % IJ SOLN: 3.0000 mL | Freq: Two times a day (BID) | INTRAMUSCULAR | Status: DC

## 2012-06-25 MED ORDER — FUROSEMIDE 40 MG PO TABS
40.0000 mg | ORAL_TABLET | Freq: Every day | ORAL | Status: DC
  Administered 2012-06-26 – 2012-06-28 (×3): 40 mg via ORAL
  Filled 2012-06-25 (×4): qty 1

## 2012-06-25 MED ORDER — DIPHENHYDRAMINE HCL 50 MG/ML IJ SOLN
INTRAMUSCULAR | Status: AC
  Filled 2012-06-25: qty 1

## 2012-06-25 MED ORDER — MIDAZOLAM HCL 10 MG/2ML IJ SOLN
INTRAMUSCULAR | Status: AC
  Filled 2012-06-25: qty 4

## 2012-06-25 MED ORDER — SODIUM CHLORIDE 0.45 % IV SOLN
INTRAVENOUS | Status: DC
  Administered 2012-06-25: 500 mL via INTRAVENOUS

## 2012-06-25 MED ORDER — FUROSEMIDE 10 MG/ML IJ SOLN
INTRAMUSCULAR | Status: AC
  Filled 2012-06-25: qty 4

## 2012-06-25 MED ORDER — SODIUM CHLORIDE 0.9 % IV SOLN: 250.0000 mL | INTRAVENOUS | Status: DC | PRN

## 2012-06-25 MED ORDER — SODIUM CHLORIDE 0.45 % IV SOLN: INTRAVENOUS | Status: DC

## 2012-06-25 MED ORDER — LIDOCAINE VISCOUS 2 % MT SOLN
OROMUCOSAL | Status: AC
  Filled 2012-06-25: qty 15

## 2012-06-25 MED ORDER — FUROSEMIDE 10 MG/ML IJ SOLN
20.0000 mg | Freq: Once | INTRAMUSCULAR | Status: AC
  Administered 2012-06-25: 20 mg via INTRAVENOUS

## 2012-06-25 MED ORDER — SODIUM CHLORIDE 0.9 % IJ SOLN: 3.0000 mL | INTRAMUSCULAR | Status: DC | PRN

## 2012-06-25 MED ORDER — LIDOCAINE VISCOUS 2 % MT SOLN
OROMUCOSAL | Status: DC | PRN
  Administered 2012-06-25: 10 mL via OROMUCOSAL

## 2012-06-25 MED ORDER — MIDAZOLAM HCL 10 MG/2ML IJ SOLN
INTRAMUSCULAR | Status: DC | PRN
  Administered 2012-06-25: 1 mg via INTRAVENOUS
  Administered 2012-06-25: 2 mg via INTRAVENOUS

## 2012-06-25 MED ORDER — FENTANYL CITRATE 0.05 MG/ML IJ SOLN
INTRAMUSCULAR | Status: DC | PRN
  Administered 2012-06-25 (×2): 25 ug via INTRAVENOUS

## 2012-06-25 MED ORDER — GENTAMICIN IN SALINE 1.6-0.9 MG/ML-% IV SOLN
80.0000 mg | Freq: Three times a day (TID) | INTRAVENOUS | Status: DC
  Administered 2012-06-25 – 2012-06-26 (×3): 80 mg via INTRAVENOUS
  Filled 2012-06-25 (×5): qty 50

## 2012-06-25 NOTE — Progress Notes (Addendum)
[facility] Progress Note  06/25/2012  Subjective: Pt was seen and examined this morning after speaking with Night NP coverage Cordelia Pen.  Pt says he felt a little better after receiving lasix 20 mg IV this morning but still appears SOB.  I ordered an additional 20 mg IV of lasix.  Will continue to monitor him closely.  Maryln Manuel, MD  Pager [phone]  Objective:  Vital signs in last 24 hours: Filed Vitals:   06/25/12 1006 06/25/12 1210 06/25/12 1228 06/25/12 1231  BP: 109/55 126/46  123/46  Pulse: 108     Temp: 98 F (36.7 C) 98.7 F (37.1 C)    TempSrc: Oral Oral    Resp:  35  56  Height:      Weight:      SpO2:  96% 98% 98%   Weight change:   Intake/Output Summary (Last 24 hours) at 06/25/12 1246 Last data filed at 06/25/12 0930  Gross per 24 hour  Intake      0 ml  Output    400 ml  Net   -400 ml   No results found for this basename: HGBA1C   Lab Results  Component Value Date   CREATININE 0.96 06/25/2012    Review of Systems As above, otherwise all reviewed and reported negative  Physical Exam General - awake, no distress, cooperative HEENT - NCAT, MMM Lungs - BBS, CTA CV - normal s1, s2 sounds Abd - soft, nondistended, no masses, nontender Ext - no C/C/E  Lab Results: Results for orders placed during the hospital encounter of 06/24/12 (from the past 24 hour(s))  COMPREHENSIVE METABOLIC PANEL     Status: Abnormal   Collection Time   06/25/12 12:12 AM      Component Value Range   Sodium 137  135 - 145 mEq/L   Potassium 3.9  3.5 - 5.1 mEq/L   Chloride 101  96 - 112 mEq/L   CO2 26  19 - 32 mEq/L   Glucose, Bld 119 (*) 70 - 99 mg/dL   BUN 10  6 - 23 mg/dL   Creatinine, Ser 2.44  0.50 - 1.35 mg/dL   Calcium 8.2 (*) 8.4 - 10.5 mg/dL   Total Protein 6.2  6.0 - 8.3 g/dL   Albumin 2.6 (*) 3.5 - 5.2 g/dL   AST 31  0 - 37 U/L   ALT 36  0 - 53 U/L   Alkaline Phosphatase 92  39 - 117 U/L   Total Bilirubin 0.5  0.3 - 1.2 mg/dL   GFR calc non Af Amer  >90  >90 mL/min   GFR calc Af Amer >90  >90 mL/min  CBC WITH DIFFERENTIAL     Status: Abnormal   Collection Time   06/25/12 12:12 AM      Component Value Range   WBC 9.1  4.0 - 10.5 K/uL   RBC 3.95 (*) 4.22 - 5.81 MIL/uL   Hemoglobin 10.7 (*) 13.0 - 17.0 g/dL   HCT 01.0 (*) 27.2 - 53.6 %   MCV 82.0  78.0 - 100.0 fL   MCH 27.1  26.0 - 34.0 pg   MCHC 33.0  30.0 - 36.0 g/dL   RDW 64.4  03.4 - 74.2 %   Platelets 358  150 - 400 K/uL   Neutrophils Relative 83 (*) 43 - 77 %   Neutro Abs 7.5  1.7 - 7.7 K/uL   Lymphocytes Relative 11 (*) 12 - 46 %   Lymphs Abs 1.0  0.7 - 4.0 K/uL   Monocytes Relative 6  3 - 12 %   Monocytes Absolute 0.5  0.1 - 1.0 K/uL   Eosinophils Relative 1  0 - 5 %   Eosinophils Absolute 0.1  0.0 - 0.7 K/uL   Basophils Relative 0  0 - 1 %   Basophils Absolute 0.0  0.0 - 0.1 K/uL  COMPREHENSIVE METABOLIC PANEL     Status: Abnormal   Collection Time   06/25/12  6:05 AM      Component Value Range   Sodium 137  135 - 145 mEq/L   Potassium 4.0  3.5 - 5.1 mEq/L   Chloride 103  96 - 112 mEq/L   CO2 25  19 - 32 mEq/L   Glucose, Bld 125 (*) 70 - 99 mg/dL   BUN 10  6 - 23 mg/dL   Creatinine, Ser 1.61  0.50 - 1.35 mg/dL   Calcium 8.3 (*) 8.4 - 10.5 mg/dL   Total Protein 5.9 (*) 6.0 - 8.3 g/dL   Albumin 2.4 (*) 3.5 - 5.2 g/dL   AST 27  0 - 37 U/L   ALT 32  0 - 53 U/L   Alkaline Phosphatase 86  39 - 117 U/L   Total Bilirubin 0.3  0.3 - 1.2 mg/dL   GFR calc non Af Amer >90  >90 mL/min   GFR calc Af Amer >90  >90 mL/min  CBC WITH DIFFERENTIAL     Status: Abnormal   Collection Time   06/25/12  6:05 AM      Component Value Range   WBC 9.9  4.0 - 10.5 K/uL   RBC 3.81 (*) 4.22 - 5.81 MIL/uL   Hemoglobin 10.1 (*) 13.0 - 17.0 g/dL   HCT 09.6 (*) 04.5 - 40.9 %   MCV 81.4  78.0 - 100.0 fL   MCH 26.5  26.0 - 34.0 pg   MCHC 32.6  30.0 - 36.0 g/dL   RDW 81.1  91.4 - 78.2 %   Platelets 339  150 - 400 K/uL   Neutrophils Relative 80 (*) 43 - 77 %   Neutro Abs 7.9 (*) 1.7 - 7.7  K/uL   Lymphocytes Relative 13  12 - 46 %   Lymphs Abs 1.3  0.7 - 4.0 K/uL   Monocytes Relative 6  3 - 12 %   Monocytes Absolute 0.6  0.1 - 1.0 K/uL   Eosinophils Relative 1  0 - 5 %   Eosinophils Absolute 0.1  0.0 - 0.7 K/uL   Basophils Relative 0  0 - 1 %   Basophils Absolute 0.0  0.0 - 0.1 K/uL  MAGNESIUM     Status: Normal   Collection Time   06/25/12  6:05 AM      Component Value Range   Magnesium 2.1  1.5 - 2.5 mg/dL  GLUCOSE, CAPILLARY     Status: Abnormal   Collection Time   06/25/12  8:06 AM      Component Value Range   Glucose-Capillary 112 (*) 70 - 99 mg/dL   Comment 1 Notify RN    CARDIAC PANEL(CRET KIN+CKTOT+MB+TROPI)     Status: Normal   Collection Time   06/25/12  8:34 AM      Component Value Range   Total CK 39  7 - 232 U/L   CK, MB 1.3  0.3 - 4.0 ng/mL   Troponin I <0.30  <0.30 ng/mL   Relative Index RELATIVE INDEX IS INVALID  0.0 -  2.5  GLUCOSE, CAPILLARY     Status: Abnormal   Collection Time   06/25/12 11:39 AM      Component Value Range   Glucose-Capillary 114 (*) 70 - 99 mg/dL   Comment 1 Notify RN      Micro Results: No results found for this or any previous visit (from the past 240 hour(s)).  Medications:  Scheduled Meds:   . ampicillin (OMNIPEN) IV  2 g Intravenous Q4H  . furosemide      . furosemide  20 mg Intravenous Once  . furosemide  20 mg Intravenous Once  . furosemide  20 mg Oral Daily  . gentamicin  80 mg Intravenous Q8H  . sodium chloride  3 mL Intravenous Q12H  . sodium chloride  3 mL Intravenous Q12H  . sodium chloride  3 mL Intravenous Q12H  . sodium chloride  3 mL Intravenous Q12H  . vancomycin  1,000 mg Intravenous Q8H  . DISCONTD: gentamicin  70 mg Intravenous Q8H  . DISCONTD: sodium chloride  3 mL Intravenous Q12H   Continuous Infusions:   . sodium chloride 500 mL (06/25/12 1220)  . sodium chloride    . 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 Stanford Breed recommendations patient has been started on vancomycin gentamicin and ampicillin to cover for endocarditis. Repeat blood cultures has been ordered. At this time we will be continuing his Lasix as prescribed by Dr. Eldridge Dace. Patient is to get a TEE today.  He may need CT surgery consultation.  Patient does have sinus tachycardia at this time closely monitor in telemetry.  Further recommendations per infectious disease and cardiology.  Fluid Overload/SOB - continue lasix as above, monitor I/Os, supplemental oxygen as needed.  Hypoalbuminemia - get dietitian consult    LOS: 1 day   Kalla Watson 06/25/2012, 12:46 PM  Cleora Fleet, MD, CDE, FAAFP [facility] [place], [place]  [phone]

## 2012-06-25 NOTE — Care Management Note (Signed)
    Page 1 of 1   06/28/2012     1:48:11 PM   CARE MANAGEMENT NOTE 06/28/2012  Patient:  Austin Charles, Austin Charles   Account Number:  [id number]  Date Initiated:  06/25/2012  Documentation initiated by:  GRAVES-BIGELOW,Sotiria Keast  Subjective/Objective Assessment:   Pt admitted for Subacute bacterial endocarditis. Plan for IV ABX therapy. P is from home alone and family is visiting from [place]. Parents will be here for a while.     Action/Plan:   CM did provide pt with a list for agencies for [facility] services. Pt to make agency choice. MD please write order for [facility] services.   Anticipated DC Date:  06/30/2012   Anticipated DC Plan:  HOME W HOME HEALTH SERVICES      DC Planning Services  CM consult      Choice offered to / List presented to:             Status of service:  Completed, signed off Medicare Important Message given?   (If response is "NO", the following Medicare IM given date fields will be blank) Date Medicare IM given:   Date Additional Medicare IM given:    Discharge Disposition:  ACUTE TO ACUTE TRANS  Per UR Regulation:  Reviewed for med. necessity/level of care/duration of stay  If discussed at Long Length of Stay Meetings, dates discussed:    Comments:  06-28-12 1345 Tomi Bamberger, RN, BSN [phone] Pt  planned for transfer to [facility]. Floor will be handeling d/c. No further needs from CM at this time.   06/27/12 810 Amy Robson RN BSN CM Called by Floor RN at 945pm inquiring what she should do to prepare for transfering pt to [facility]. Transfer will be today. Advised to prepare medical records, obtain receiving MD's name if possible and the am RN will report to the RN at [facility]. The CSW will set up ambulance transfer.

## 2012-06-25 NOTE — Progress Notes (Signed)
Micro Result update:  [facility] Micro results as of Friday evening, 8/2:  Blood cx from 7/31: viridans streptococcus group  Cefepime  S Cefotaxime  S Ceftriaxone  S Chloramphenicol S Clindamycin  S Erythromycin  S Levofloxacin  S Penicillin  S Vancomycin  S  Assessment/Plan: strep viridans native AV endocarditis  - will call [facility] on 8/3 to see if they can test for MIC and if the isolate can be sequenced for further identification, as well  - would recommend that the antibiotics be changed to ceftriaxone 2gm IV daily plus gentamicin. Continue for now with gentamicin until we can find MIC to penicillin to see if it is highly sensitive vs. Intermediate. If it is highly sensitive, then the patient will only need ceftriaxone x 4 wks; If it is the latter, then 2 drug regimen is recommended.  - MIC results should return within 1-2 days in order to determine final abtx recs.  - Merceda Elks is available for further ID recs over the weekend.  Duke Salvia Drue Second MD MPH [facility] [phone] pager [phone] cell

## 2012-06-25 NOTE — Progress Notes (Signed)
SUBJECTIVE:  Patient felt warm today.  He does not think he had a fever.  He feels like his shortness of breath has gotten a little bit worse since starting antibiotics.  His parents are in the room with him today.  OBJECTIVE:   Vitals:   Filed Vitals:   06/24/12 2100 06/25/12 0500 06/25/12 0540 06/25/12 1006  BP: 128/56 118/57  109/55  Pulse: 107 119  108  Temp: 98.3 F (36.8 C) 98.7 F (37.1 C)  98 F (36.7 C)  TempSrc: Oral Oral  Oral  Resp:   26   Height: 5\' 6"  (1.676 m)     Weight: 72.122 kg (159 lb) 72.122 kg (159 lb)    SpO2: 99% 84% 93%    I&O's:   Intake/Output Summary (Last 24 hours) at 06/25/12 1208 Last data filed at 06/25/12 0930  Gross per 24 hour  Intake      0 ml  Output    400 ml  Net   -400 ml   TELEMETRY: Reviewed telemetry pt in normal sinus rhythm:     PHYSICAL EXAM General: Well developed, well nourished, in no acute distress Head: Normal cephalic and atramatic  Lungs:   Clear bilaterally to auscultation and percussion. Heart:   HRRR S1 S2 2/6 systolic murmur, 2/6 diastolic murmur Abdomen: Bowel sounds are positive, abdomen soft and non-tender without masses or                  Hernia's noted. Msk:  Back normal, normal gait. Normal strength and tone for age. Extremities:   No clubbing, cyanosis or edema.  DP +1 Neuro: Alert and oriented X 3. Psych:  Normal affect, responds appropriately   LABS: Basic Metabolic Panel:  Basename 06/25/12 0605 06/25/12 0012  NA 137 137  K 4.0 3.9  CL 103 101  CO2 25 26  GLUCOSE 125* 119*  BUN 10 10  CREATININE 0.96 0.98  CALCIUM 8.3* 8.2*  MG 2.1 --  PHOS -- --   Liver Function Tests:  [facility] 06/25/12 0605 06/25/12 0012  AST 27 31  ALT 32 36  ALKPHOS 86 92  BILITOT 0.3 0.5  PROT 5.9* 6.2  ALBUMIN 2.4* 2.6*   No results found for this basename: LIPASE:2,AMYLASE:2 in the last 72 hours CBC:  Basename 06/25/12 0605 06/25/12 0012  WBC 9.9 9.1  NEUTROABS 7.9* 7.5  HGB 10.1* 10.7*  HCT 31.0*  32.4*  MCV 81.4 82.0  PLT 339 358   Cardiac Enzymes:  Basename 06/25/12 0834  CKTOTAL 39  CKMB 1.3  CKMBINDEX --  TROPONINI <0.30   BNP: No components found with this basename: POCBNP:3 D-Dimer: No results found for this basename: DDIMER:2 in the last 72 hours Hemoglobin A1C: No results found for this basename: HGBA1C in the last 72 hours Fasting Lipid Panel: No results found for this basename: CHOL,HDL,LDLCALC,TRIG,CHOLHDL,LDLDIRECT in the last 72 hours Thyroid Function Tests: No results found for this basename: TSH,T4TOTAL,FREET3,T3FREE,THYROIDAB in the last 72 hours Anemia Panel: No results found for this basename: VITAMINB12,FOLATE,FERRITIN,TIBC,IRON,RETICCTPCT in the last 72 hours Coag Panel:   No results found for this basename: INR, PROTIME    RADIOLOGY: Dg Chest 2 View  06/08/2012  *RADIOLOGY REPORT*  Clinical Data: Cough for several months  CHEST - 2 VIEW  Comparison: None.  Findings: No active infiltrate or effusion is seen.  Mild peribronchial thickening is noted.  Mediastinal contours appear normal.  The heart is within upper limits of normal.  No bony abnormality is seen.  IMPRESSION: Mild peribronchial thickening.  No active process.  Original Report Authenticated By: Juline Patch, M.D.   Dg Chest Port 1 View  06/25/2012  *RADIOLOGY REPORT*  Clinical Data: Shortness of breath, cough.  PORTABLE CHEST - 1 VIEW  Comparison: 06/08/2012  Findings: Interval development of bilateral airspace opacities. Heart size upper normal to mildly enlarged.  Central vascular congestion.  No definite pleural effusion.  No pneumothorax.  No acute osseous finding.  IMPRESSION: Interval development of bilateral airspace opacities; advanced pulmonary edema versus multifocal pneumonia.  Original Report Authenticated By: Waneta Martins, M.D.      ASSESSMENT: Subacute bacterial endocarditis.  Blood cultures have grown out enterococcus.  PLAN:  Continue antibiotics.  He will likely need  a prolonged course of ampicillin/ gentamicin.  I spoke at length to the patient and his family today regarding a likely history of this illness.  We will plan for transesophageal echocardiogram today to better assess both the aortic and mitral valve.  By transthoracic echo, he did have significant aortic insufficiency and at least mild to moderate mitral regurgitation.    We will likely have to obtain a cardiac surgery consult to evaluate for the possibility of valve repair/replacement after the transesophageal echo.  Hemodynamically, he is stable.  He does have some evidence of symptomatic heart failure and fluid overload.  I think his chronic cough over the past few months it has likely been related to excess fluid.  Continue diuresis.  Low albumin, especially given his young age.  He likely has been chronically ill over the past few months.  He also has concerns about exposure to mold in his older house, as well as prior exposure to a bat.  He received rabies vaccine.  Appreciate infectious disease input.  I discussed the case with Dr. Ilsa Iha this morning.  Corky Crafts., MD  06/25/2012  12:08 PM

## 2012-06-25 NOTE — Progress Notes (Signed)
Alerted by NT that patient experiencing O2 saturation of 84%.  Previously, SPO2 was 99% on room air.  Assessed patient and noted crackles in lower lung fields, mild tachypnea with respiratory rate of 26, increased tachycardia from baseline assessment.  Patient alert, oriented and able to carry-on a thoughtful conversation.  Placed on 4L O2 per nasal canula and reassessed SPO2 at 2 minutes; saturation increased to 93%.  Patient has received multiple doses of antibiotics since admission; suspect volume overload.  Paged Cordelia Pen, NP on-call for [facility] to make aware of changes in patient condition.  NP to assess patient.  Other vital signs are stable; grossly unchanged from previous values.  No pain reported.  Placed on continuous SPO2 monitoring.  Await NP assessment and orders.  Continuing to monitor closely.

## 2012-06-25 NOTE — CV Procedure (Signed)
TEE performed. Bicuspid aortic valve with vegetation.  Severe aortic insufficiency.  See [employer] for full report.  Obtain CVTS consult.

## 2012-06-25 NOTE — Progress Notes (Addendum)
INITIAL ADULT NUTRITION ASSESSMENT Date: 06/25/2012   Time: 3:06 PM  INTERVENTION:  No nutrition intervention at this time -- pt declined RD to continue to follow  Reason for Assessment: Consult  ASSESSMENT: Male 31 y.o.  Dx: Endocarditis  Hx:  Past Medical History  Diagnosis Date  . 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 Heart murmur   . Aortic regurgitation 06/24/2012    severe/H&P  . Mitral regurgitation 06/24/2012    moderate/H&P  . Exertional dyspnea 06/24/2012  . Exposure to bat without known bite ~ 05/2012    "had full course of rabies shots" (06/24/2012)    Related Meds:     . furosemide      . furosemide  20 mg Intravenous Once  . furosemide  20 mg Intravenous Once  . furosemide  40 mg Oral Daily  . gentamicin  80 mg Intravenous Q8H  . sodium chloride  3 mL Intravenous Q12H  . sodium chloride  3 mL Intravenous Q12H  . vancomycin  1,000 mg Intravenous Q8H  . DISCONTD: ampicillin (OMNIPEN) IV  2 g Intravenous Q4H  . DISCONTD: furosemide  20 mg Oral Daily  . DISCONTD: gentamicin  70 mg Intravenous Q8H  . DISCONTD: sodium chloride  3 mL Intravenous Q12H  . DISCONTD: sodium chloride  3 mL Intravenous Q12H  . DISCONTD: sodium chloride  3 mL Intravenous Q12H    Ht: 5\' 6"  (167.6 cm)  Wt: 159 lb (72.122 kg)  Ideal Wt: 64.5 kg % Ideal Wt: 112%  Usual Wt: 160 lb % Usual Wt: 99%  Body mass index is 25.66 kg/(m^2).  Food/Nutrition Related Hx: no triggers per admission nutrition screen  Labs:  CMP     Component Value Date/Time   NA 137 06/25/2012 0605   K 4.0 06/25/2012 0605   CL 103 06/25/2012 0605   CO2 25 06/25/2012 0605   GLUCOSE 125* 06/25/2012 0605   BUN 10 06/25/2012 0605   CREATININE 0.96 06/25/2012 0605   CREATININE 0.89 06/24/2012 1524   CALCIUM 8.3* 06/25/2012 0605   PROT 5.9* 06/25/2012 0605   ALBUMIN 2.4* 06/25/2012 0605   AST 27 06/25/2012 0605   ALT 32 06/25/2012 0605   ALKPHOS 86 06/25/2012 0605   BILITOT 0.3 06/25/2012 0605   GFRNONAA >90 06/25/2012 0605   GFRAA  >90 06/25/2012 0605     Intake/Output Summary (Last 24 hours) at 06/25/12 1507 Last data filed at 06/25/12 0930  Gross per 24 hour  Intake      0 ml  Output    400 ml  Net   -400 ml    CBG (last 3)   Basename 06/25/12 1139 06/25/12 0806  GLUCAP 114* 112*    Diet Order: General  Supplements/Tube Feeding: N/A  IVF:    DISCONTD: sodium chloride   DISCONTD: sodium chloride Last Rate: 500 mL (06/25/12 1220)  DISCONTD: sodium chloride     Estimated Nutritional Needs:   Kcal: 2000-2200 Protein: 100-110 gm Fluid:2.0-2.2 L  Patient with new onset heart murmur and blood cultures positive for enterococcus; over the past month, he states his appetite has been slightly decreased; also reports < 10 lb weight loss (5-7 per chart review) -- not significant for time frame; no current % PO intake recorded; albumin low likely due to acute inflammatory response; declining addition of supplements at this time  NUTRITION DIAGNOSIS: -No nutrition diagnosis at this time  RELATED TO: ---  AS EVIDENCE BY: ---  MONITORING/EVALUATION(Goals): Goal: Oral intake with meals to  meet >/= 90% of estimated nutrition needs Monitor: PO intake, weight, labs, I/O's  EDUCATION NEEDS: -No education needs identified at this time  Dietitian #: [phone]  DOCUMENTATION CODES Per approved criteria  -Not Applicable    Alger Memos 06/25/2012, 3:06 PM

## 2012-06-25 NOTE — Consult Note (Signed)
[address]            [place] [address]          [phone]       Austin Charles [facility] Medical Record #[id number] Date of Birth: Oct 08, 1981  No ref. provider found DEFAULT,PROVIDER, MD  Chief Complaint:   No chief complaint on file.  shortness of breath, weakness, cough, weight loss, positive blood cultures  History of Present Illness:     31 year old Caucasian male recently admitted the hospital after having positive blood cultures which have grown out enterococcus-strep viridans. The patient is felt poorly for over a month. He most recently has developed shortness of breath and coughing blood-tinged sputum. His last x-ray showed fairly severe pulmonary edema. Office evaluation by Dr. Vertis Kelch. included a 2-D echo which demonstrated a bicuspid aortic valve with severe AI. LV dilatation was evident and LV systolic function was fairly well maintained. The patient is been seen by infectious disease with place the patient on gentamicin vancomycin therapy and he is been on 40 hours of therapy. A blood culture in the hospital is no growth so far. He has denied any neurologic symptoms or her for oh embolic symptoms except for some transient tenderness and swelling in some of his fingers. The patient denies diabetes or other chronic illnesses or recurrent infections.   Current Activity/ Functional Status: The patient is been able to work full-time until recently.   Past Medical History  Diagnosis Date  . 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 Heart murmur   . Aortic regurgitation 06/24/2012    severe/H&P  . Mitral regurgitation 06/24/2012    moderate/H&P  . Exertional dyspnea 06/24/2012  . Exposure to bat without known bite ~ 05/2012    "had full course of rabies shots" (06/24/2012)    Past Surgical History  Procedure Date  . Hernia repair 1980    "I think in his stomach"/father    History  Smoking status  . Former Smoker -- 0.5 packs/day  for 7 years  . Types: Cigarettes  . Quit date: 05/25/2011  Smokeless tobacco  . Never Used    History  Alcohol Use  . 1.2 oz/week  . 1 Cans of beer, 1 Shots of liquor per week    History   Social History  . Marital Status: Single    Spouse Name: N/A    Number of Children: N/A  . Years of Education: N/A   Occupational History  . Not on file.   Social History Main Topics  . Smoking status: Former Smoker -- 0.5 packs/day for 7 years    Types: Cigarettes    Quit date: 05/25/2011  . Smokeless tobacco: Never Used  . Alcohol Use: 1.2 oz/week    1 Cans of beer, 1 Shots of liquor per week  . Drug Use: No  . Sexually Active: Yes   Other Topics Concern  . Not on file   Social History Narrative  . No narrative on file    No Known Allergies  Current Facility-Administered Medications  Medication Dose Route Frequency Provider Last Rate Last Dose  . acetaminophen (TYLENOL) tablet 650 mg  650 mg Oral Q6H PRN Eduard Clos, MD       Or  . acetaminophen (TYLENOL) suppository 650 mg  650 mg Rectal Q6H PRN Eduard Clos, MD      .  furosemide (LASIX) 10 MG/ML injection           . furosemide (LASIX) injection 20 mg  20 mg Intravenous Once Army Chaco, NP   20 mg at 06/25/12 4540  . furosemide (LASIX) injection 20 mg  20 mg Intravenous Once Clanford Cyndie Mull, MD   20 mg at 06/25/12 1010  . furosemide (LASIX) tablet 40 mg  40 mg Oral Daily Corky Crafts, MD      . gentamicin (GARAMYCIN) IVPB 80 mg  80 mg Intravenous Q8H Clanford Cyndie Mull, MD   80 mg at 06/25/12 1737  . ondansetron (ZOFRAN) tablet 4 mg  4 mg Oral Q6H PRN Eduard Clos, MD       Or  . ondansetron [facility]) injection 4 mg  4 mg Intravenous Q6H PRN Eduard Clos, MD      . sodium chloride 0.9 % injection 3 mL  3 mL Intravenous Q12H Eduard Clos, MD   3 mL at 06/25/12 1000  . sodium chloride 0.9 % injection 3 mL  3 mL Intravenous Q12H Eduard Clos, MD   3 mL at 06/24/12  2245  . vancomycin (VANCOCIN) IVPB 1000 mg/200 mL premix  1,000 mg Intravenous [address] [place], [place]   1,000 mg at 06/25/12 1618  . DISCONTD: 0.45 % sodium chloride infusion   Intravenous Continuous Corky Crafts, MD      . DISCONTD: 0.45 % sodium chloride infusion   Intravenous Continuous Corky Crafts, MD 20 mL/hr at 06/25/12 1220 500 mL at 06/25/12 1220  . DISCONTD: 0.45 % sodium chloride infusion   Intravenous Continuous Corky Crafts, MD      . DISCONTD: 0.9 %  sodium chloride infusion  250 mL Intravenous PRN Corky Crafts, MD      . DISCONTD: 0.9 %  sodium chloride infusion  250 mL Intravenous PRN Corky Crafts, MD      . DISCONTD: 0.9 %  sodium chloride infusion  250 mL Intravenous PRN Corky Crafts, MD      . DISCONTD: ampicillin (OMNIPEN) 2 g in sodium chloride 0.9 % 50 mL IVPB  2 g Intravenous Q4H Eduard Clos, MD   2 g at 06/25/12 1431  . DISCONTD: fentaNYL (SUBLIMAZE) injection    PRN Corky Crafts, MD   25 mcg at 06/25/12 1251  . DISCONTD: furosemide (LASIX) tablet 20 mg  20 mg Oral Daily Eduard Clos, MD   20 mg at 06/25/12 1000  . DISCONTD: gentamicin (GARAMYCIN) 70 mg in dextrose 5 % 50 mL IVPB  70 mg Intravenous [address] [place], [place]   70 mg at 06/25/12 0854  . DISCONTD: lidocaine (XYLOCAINE) 2 % viscous mouth solution    PRN Corky Crafts, MD   10 mL at 06/25/12 1245  . DISCONTD: midazolam (VERSED) injection    PRN Corky Crafts, MD   1 mg at 06/25/12 1251  . DISCONTD: sodium chloride 0.9 % injection 3 mL  3 mL Intravenous Q12H Corky Crafts, MD      . DISCONTD: sodium chloride 0.9 % injection 3 mL  3 mL Intravenous PRN Corky Crafts, MD      . DISCONTD: sodium chloride 0.9 % injection 3 mL  3 mL Intravenous Q12H Corky Crafts, MD      . DISCONTD: sodium chloride 0.9 % injection 3 mL  3 mL Intravenous PRN Corky Crafts, MD      .  DISCONTD: sodium  chloride 0.9 % injection 3 mL  3 mL Intravenous Q12H Corky Crafts, MD      . DISCONTD: sodium chloride 0.9 % injection 3 mL  3 mL Intravenous PRN Corky Crafts, MD         Family History  Problem Relation Age of Onset  . Coronary artery disease Other      Review of Systems:     Cardiac Review of Systems: Y or N  Chest Pain [    ]  Resting SOB [   ] Exertional SOB  Cove.Etienne  ]  Orthopnea [  ]   Pedal Edema [   ]    Palpitations [  ] Syncope  [ n ]   Presyncope [   ]  General Review of Systems: [Y] = yes [  ]=no Constitional: recent weight change [  ]; anorexia [  ]; fatigue Cove.Etienne  ]; nausea [  ]; night sweats [  ]; fever [  ]y; or chills Cove.Etienne  ];                                                                                                                                          Dental: poor dentition[  ]; Last Dentist visit: February 2013  Eye : blurred vision [  ]; diplopia [   ]; vision changes [n  ];  Amaurosis fugax[  ]; Resp: cough y[  ];  wheezing[  ];  hemoptysis[  ]; shortness of breath[  ]; paroxysmal nocturnal dyspnea[  ]; dyspnea on exertion[  ]; or orthopnea[  ];  GI:  gallstones[  ], vomiting[  ];  dysphagia[  ]; melena[  ];  hematochezia [  ]; heartburn[  ];   Hx of  Colonoscopy[  ]; GU: kidney stones [  ]; hematuria[ n ];   dysuria [  ];  nocturia[  ];  history of     obstruction [  ];                 Skin: rash, swelling[  ];, hair loss[  ];  peripheral edema[ n ];  or itching[  ]; Musculosketetal: myalgias[  ];  joint swelling[y  ];  joint erythema[  ];  joint pain[  ];  back pain[  ];  Heme/Lymph: bruising[  ];  bleeding[  ];  anemia[  ];  Neuro: TIA[  ];  headaches[  ];  stroke[  ];  vertigo[  ];  seizures[n  ];   paresthesias[  ];  difficulty walking[  ];  Psych:depression[  ]; anxiety[  ];  Endocrine: diabetes[n  ];  thyroid dysfunction[  ];  Immunizations: Flu [  ]; Pneumococcal[  ];  Other:  Physical Exam: BP 123/64  Pulse 108  Temp 98.8 F (37.1 C)  (Oral)  Resp 37  Ht 5\' 6"  (1.676 m)  Wt 159 lb (72.122 kg)  BMI 25.66 kg/m2  SpO2 91%  Exam Gen. 31 year old Caucasian male appears somewhat chronically ill with an ice bag by his neck do to feeling warm head elevated 30 HEENT normocephalic dentition good Neck no JVD mass or bruit Thorax without deformity no tenderness bilateral rales Cardiac tachycardic diastolic murmur present Abdomen soft without pulsatile mass Extremities warm well perfused no petechiae Vascular positive pulses  Diagnostic Studies & Laboratory data:   TEE performed  Recent Radiology Findings:   Dg Chest Port 1 View  06/25/2012  *RADIOLOGY REPORT*  Clinical Data: Shortness of breath, cough.  PORTABLE CHEST - 1 VIEW  Comparison: 06/08/2012  Findings: Interval development of bilateral airspace opacities. Heart size upper normal to mildly enlarged.  Central vascular congestion.  No definite pleural effusion.  No pneumothorax.  No acute osseous finding.  IMPRESSION: Interval development of bilateral airspace opacities; advanced pulmonary edema versus multifocal pneumonia.  Original Report Authenticated By: Waneta Martins, M.D.      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  With severe  AI and clear evidence of heart failure I would recommend few days of IV antibiotics with documented negative blood cultures that aortic valve replacement with a probable mechanical valve and continued postoperative antibiotics for another 4-6 weeks. Coronary arteriography prior to surgery will have to be considered in view of the patient's strong family history of CAD.  Situation discussed with patient and family. Family has expressed interest in obtaining a second opinion for surgery with CT surgery at [facility] in [place] and I agree with their interest in a second opinion. Sending a disc of the TEE to the main aortic CT surgeon  at [facility] Dr. Bevelyn Ngo would be my recommendation  We'll follow.Marland Kitchen

## 2012-06-25 NOTE — Progress Notes (Signed)
  Echocardiogram Echocardiogram Transesophageal has been performed.  Austin Charles 06/25/2012, 1:24 PM

## 2012-06-26 ENCOUNTER — Inpatient Hospital Stay (HOSPITAL_COMMUNITY): Payer: BC Managed Care – PPO

## 2012-06-26 DIAGNOSIS — B955 Unspecified streptococcus as the cause of diseases classified elsewhere: Secondary | ICD-10-CM | POA: Diagnosis present

## 2012-06-26 DIAGNOSIS — A491 Streptococcal infection, unspecified site: Secondary | ICD-10-CM

## 2012-06-26 DIAGNOSIS — I509 Heart failure, unspecified: Secondary | ICD-10-CM

## 2012-06-26 DIAGNOSIS — I330 Acute and subacute infective endocarditis: Principal | ICD-10-CM

## 2012-06-26 DIAGNOSIS — I351 Nonrheumatic aortic (valve) insufficiency: Secondary | ICD-10-CM | POA: Diagnosis present

## 2012-06-26 LAB — HEMOGLOBIN A1C
Hgb A1c MFr Bld: 5.6 % (ref <5.7)
Mean Plasma Glucose: 114 mg/dL (ref <117)

## 2012-06-26 MED ORDER — DEXTROSE 5 % IV SOLN
2.0000 g | INTRAVENOUS | Status: DC
  Administered 2012-06-26 – 2012-06-28 (×4): 2 g via INTRAVENOUS
  Filled 2012-06-26 (×4): qty 2

## 2012-06-26 NOTE — Progress Notes (Signed)
[facility] Progress Note  06/26/2012  Subjective: Pt says that he feels much better today. The SOB has gotten much better.  No CP.  No fever or chills.    Objective:  Vital signs in last 24 hours: Filed Vitals:   06/25/12 1400 06/25/12 1405 06/25/12 2100 06/26/12 0500  BP: 126/44 126/44 123/64 91/50  Pulse: 97  108 106  Temp: 98.6 F (37 C)  98.8 F (37.1 C) 98.3 F (36.8 C)  TempSrc: Oral  Oral Oral  Resp: 41 37    Height:      Weight:    68.493 kg (151 lb)  SpO2: 93% 95% 91% 95%   Weight change: -3.629 kg (-8 lb)  Intake/Output Summary (Last 24 hours) at 06/26/12 1407 Last data filed at 06/26/12 1025  Gross per 24 hour  Intake    123 ml  Output    825 ml  Net   -702 ml   No results found for this basename: HGBA1C   Lab Results  Component Value Date   CREATININE 0.96 06/25/2012    Review of Systems As above, otherwise all reviewed and reported negative  Physical Exam General - awake, no distress, cooperative HEENT - NCAT, MMM Lungs - BBS, CTA CV - systolic murmur heard Abd - soft, nondistended, no masses, nontender Ext - no C/C/E  Lab Results: No results found for this or any previous visit (from the past 24 hour(s)).  Micro Results: Recent Results (from the past 240 hour(s))  CULTURE, BLOOD (SINGLE)     Status: Normal (Preliminary result)   Collection Time   06/24/12  3:24 PM      Component Value Range Status Comment   Preliminary Report STREPTOCOCCUS SPECIES   Preliminary   CULTURE, BLOOD (SINGLE)     Status: Normal (Preliminary result)   Collection Time   06/24/12  3:24 PM      Component Value Range Status Comment   Preliminary Report STREPTOCOCCUS SPECIES   Preliminary   CULTURE, BLOOD (ROUTINE X 2)     Status: Normal (Preliminary result)   Collection Time   06/25/12 12:12 AM      Component Value Range Status Comment   Specimen Description BLOOD RIGHT ARM   Final    Special Requests BOTTLES DRAWN AEROBIC AND ANAEROBIC [facility] EACH   Final    Culture  Setup Time 06/25/2012 09:00   Final    Culture     Final    Value:        BLOOD CULTURE RECEIVED NO GROWTH TO DATE CULTURE WILL BE HELD FOR 5 DAYS BEFORE ISSUING A FINAL NEGATIVE REPORT   Report Status PENDING   Incomplete   CULTURE, BLOOD (ROUTINE X 2)     Status: Normal (Preliminary result)   Collection Time   06/25/12 12:13 AM      Component Value Range Status Comment   Specimen Description BLOOD RIGHT HAND   Final    Special Requests BOTTLES DRAWN AEROBIC AND ANAEROBIC 5CC EACH   Final    Culture  Setup Time 06/25/2012 09:00   Final    Culture     Final    Value:        BLOOD CULTURE RECEIVED NO GROWTH TO DATE CULTURE WILL BE HELD FOR 5 DAYS BEFORE ISSUING A FINAL NEGATIVE REPORT   Report Status PENDING   Incomplete     Medications:  Scheduled Meds:   . cefTRIAXone (ROCEPHIN)  IV  2 g Intravenous Q24H  .  furosemide  40 mg Oral Daily  . sodium chloride  3 mL Intravenous Q12H  . DISCONTD: ampicillin (OMNIPEN) IV  2 g Intravenous Q4H  . DISCONTD: furosemide  20 mg Oral Daily  . DISCONTD: gentamicin  80 mg Intravenous Q8H  . DISCONTD: sodium chloride  3 mL Intravenous Q12H  . DISCONTD: sodium chloride  3 mL Intravenous Q12H  . DISCONTD: sodium chloride  3 mL Intravenous Q12H  . DISCONTD: vancomycin  1,000 mg Intravenous Q8H   Continuous Infusions:   . DISCONTD: sodium chloride 500 mL (06/25/12 1220)  . DISCONTD: sodium chloride     PRN Meds:.acetaminophen, acetaminophen, ondansetron (ZOFRAN) IV, ondansetron, DISCONTD: sodium chloride, DISCONTD: sodium chloride, DISCONTD: sodium chloride, DISCONTD: sodium chloride  Assessment/Plan: Infective endocarditis with enterococcus bacteremia and severe aortic regurgitation - as per Dr. Comer/Sniders recommendations patient has been started on ceftriaxone IV to treat for strep viridans.  Repeat blood cultures has been ordered. At this time we will be continuing his Lasix as prescribed by Dr. Eldridge Dace.  PICC line 24-48 hours after  bacteremia cleared. 8/1 BC have been positive.  8/2 BC pending.   Severe Aortic Insufficiency  - pt discussing valve replacement with CT surgery  Fluid Overload/SOB - continue lasix per cardiology,  monitor I/Os, supplemental oxygen as needed.    LOS: 2 days   [name] 06/26/2012, 2:07 PM  Cleora Fleet, MD, CDE, FAAFP [facility] [place], [place]  [phone]

## 2012-06-26 NOTE — Progress Notes (Signed)
Bicuspid aortic valve endocarditis with strep on IV Rocephin Severe aortic insufficiency with pulmonary edema on presentation The patient is been afebrile the last 24 hours the remains tachycardic and short of breath Last chest x-ray shows some improvement in edema following Lasix diuresis and the patient feels more comfortable Blood cultures taken August 2 are negative so far. On exam there is a persistent diastolic murmur and a loud S3 gallop. No neuro deficits, vegetation is present on the aortic leaflet No evidence of conduction delay or annular abscess on TEE  The patient and family wish to be transferred to [facility] for surgical therapy of the endocarditis. I recommended to the patient that he undergo aortic valve replacement sooner rather than later with the optimal plan to have his blood cultures negative for least 5 days prior to surgery next week.  We will send the transesophageal echo disc to [facility] and request Dr. Jerrell Mylar to review and to possibly accept the patient in transfer. I've strongly recommended that if the patient is transferred that a medical transport service with full support capabilities beused to make the transfer as the patient has severe AI and at least moderate pulmonary edema. The patient understands there is some risk in transferring to another facility but they wish to be moved to [facility] closer to his family and support.  We'll continue to follow patient here and will be available to help as needed.

## 2012-06-26 NOTE — Progress Notes (Addendum)
Please note this is reporting of outside lab results: Micro update:  06/23/2012 blood culture from [facility]: viridans streptococcus group MIC: cetriaxone <1 Penicillin <0.06 Vancomycin <0.05  Micro here: Blood cx 8/1 NGTD Blood cx 8/2 NGTD  Interpretation: the patient has a highly sensitive penicillin viridans strep Native Valve Endocarditis with Severe AI  Recommendations:  -  to start with ceftriaxone 2gm IV daily x 4 wk; other option is 18 MU of penicillin G via continuous infusion (delivered via pump)  - we will discontinue vancomycin and gentamicin -  can place picc line once we have proven by blood cultures that he is no longer bacteremic  If you have further questions regarding management of endocarditis, Dr. Merceda Elks is available over the weekend. Or I can be reached by cell: [phone]  Aram Beecham B. Drue Second MD MPH [facility] [phone]

## 2012-06-26 NOTE — Consult Note (Signed)
Infectious Diseases Initial Consultation  Reason for Consultation:  Endocarditis   HPI: Austin Charles is a 31 y.o. male with no significant PMH seen in ID clinic and concern for endocarditis.  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  He has felt poor for about 1 month.  Had a bat exposure and rabies prophylaxis about 1 month ago.  Some dyspnea on exertion.  Some nightsweats and fatigue, myalgias.  TEE does show aortic valve vegetation.  CVTS is also consulted and considering valve replacement.  Initial cultures from [facility] reported Enterococcus but have been changed to Streptococcus viridans with MICs PCN sensitive as outlined in Dr. Feliz Beam note.  Also growing Streptococcus here in blood cultures done 8/1.  8/2 cultures are negative.    Past Medical History  Diagnosis Date  . 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 Heart murmur   . Aortic regurgitation 06/24/2012    severe/H&P  . Mitral regurgitation 06/24/2012    moderate/H&P  . Exertional dyspnea 06/24/2012  . Exposure to bat without known bite ~ 05/2012    "had full course of rabies shots" (06/24/2012)    Allergies: No Known Allergies  Current antibiotics:   MEDICATIONS:    . cefTRIAXone (ROCEPHIN)  IV  2 g Intravenous Q24H  . furosemide  40 mg Oral Daily  . sodium chloride  3 mL Intravenous Q12H  . DISCONTD: ampicillin (OMNIPEN) IV  2 g Intravenous Q4H  . DISCONTD: furosemide  20 mg Oral Daily  . DISCONTD: gentamicin  80 mg Intravenous Q8H  . DISCONTD: sodium chloride  3 mL Intravenous Q12H  . DISCONTD: sodium chloride  3 mL Intravenous Q12H  . DISCONTD: sodium chloride  3 mL Intravenous Q12H  . DISCONTD: vancomycin  1,000 mg Intravenous Q8H    History  Substance Use Topics  . Smoking status: Former Smoker -- 0.5 packs/day for 7 years    Types: Cigarettes    Quit date: 05/25/2011  . Smokeless tobacco: Never Used  . Alcohol Use: 1.2 oz/week    1 Cans of beer, 1 Shots of liquor  per week    Family History  Problem Relation Age of Onset  . Coronary artery disease Other     Review of Systems - Negative except as per HPI  OBJECTIVE: Temp:  [98.3 F (36.8 C)-98.8 F (37.1 C)] 98.3 F (36.8 C) (08/03 0500) Pulse Rate:  [97-108] 106  (08/03 0500) Resp:  [20-45] 37  (08/02 1405) BP: (91-134)/(37-64) 91/50 mmHg (08/03 0500) SpO2:  [88 %-95 %] 95 % (08/03 0500) Weight:  [151 lb (68.493 kg)] 151 lb (68.493 kg) (08/03 0500) General appearance: alert, cooperative and no distress Resp: clear to auscultation bilaterally Cardio: regular rate and rhythm and systolic and diastolic murmur noted GI: soft, non-tender; bowel sounds normal; no masses,  no organomegaly  LABS: Results for orders placed during the hospital encounter of 06/24/12 (from the past 48 hour(s))  COMPREHENSIVE METABOLIC PANEL     Status: Abnormal   Collection Time   06/25/12 12:12 AM      Component Value Range Comment   Sodium 137  135 - 145 mEq/L    Potassium 3.9  3.5 - 5.1 mEq/L    Chloride 101  96 - 112 mEq/L    CO2 26  19 - 32 mEq/L    Glucose, Bld 119 (*) 70 - 99 mg/dL    BUN 10  6 - 23 mg/dL    Creatinine,  Ser 0.98  0.50 - 1.35 mg/dL    Calcium 8.2 (*) 8.4 - 10.5 mg/dL    Total Protein 6.2  6.0 - 8.3 g/dL    Albumin 2.6 (*) 3.5 - 5.2 g/dL    AST 31  0 - 37 U/L    ALT 36  0 - 53 U/L    Alkaline Phosphatase 92  39 - 117 U/L    Total Bilirubin 0.5  0.3 - 1.2 mg/dL    GFR calc non Af Amer >90  >90 mL/min    GFR calc Af Amer >90  >90 mL/min   CBC WITH DIFFERENTIAL     Status: Abnormal   Collection Time   06/25/12 12:12 AM      Component Value Range Comment   WBC 9.1  4.0 - 10.5 K/uL    RBC 3.95 (*) 4.22 - 5.81 MIL/uL    Hemoglobin 10.7 (*) 13.0 - 17.0 g/dL    HCT 78.2 (*) 95.6 - 52.0 %    MCV 82.0  78.0 - 100.0 fL    MCH 27.1  26.0 - 34.0 pg    MCHC 33.0  30.0 - 36.0 g/dL    RDW 21.3  08.6 - 57.8 %    Platelets 358  150 - 400 K/uL    Neutrophils Relative 83 (*) 43 - 77 %    Neutro  Abs 7.5  1.7 - 7.7 K/uL    Lymphocytes Relative 11 (*) 12 - 46 %    Lymphs Abs 1.0  0.7 - 4.0 K/uL    Monocytes Relative 6  3 - 12 %    Monocytes Absolute 0.5  0.1 - 1.0 K/uL    Eosinophils Relative 1  0 - 5 %    Eosinophils Absolute 0.1  0.0 - 0.7 K/uL    Basophils Relative 0  0 - 1 %    Basophils Absolute 0.0  0.0 - 0.1 K/uL   CULTURE, BLOOD (ROUTINE X 2)     Status: Normal (Preliminary result)   Collection Time   06/25/12 12:12 AM      Component Value Range Comment   Specimen Description BLOOD RIGHT ARM      Special Requests BOTTLES DRAWN AEROBIC AND ANAEROBIC 6CC EACH      Culture  Setup Time 06/25/2012 09:00      Culture        Value:        BLOOD CULTURE RECEIVED NO GROWTH TO DATE CULTURE WILL BE HELD FOR 5 DAYS BEFORE ISSUING A FINAL NEGATIVE REPORT   Report Status PENDING     CULTURE, BLOOD (ROUTINE X 2)     Status: Normal (Preliminary result)   Collection Time   06/25/12 12:13 AM      Component Value Range Comment   Specimen Description BLOOD RIGHT HAND      Special Requests BOTTLES DRAWN AEROBIC AND ANAEROBIC 5CC EACH      Culture  Setup Time 06/25/2012 09:00      Culture        Value:        BLOOD CULTURE RECEIVED NO GROWTH TO DATE CULTURE WILL BE HELD FOR 5 DAYS BEFORE ISSUING A FINAL NEGATIVE REPORT   Report Status PENDING     COMPREHENSIVE METABOLIC PANEL     Status: Abnormal   Collection Time   06/25/12  6:05 AM      Component Value Range Comment   Sodium 137  135 - 145 mEq/L  Potassium 4.0  3.5 - 5.1 mEq/L    Chloride 103  96 - 112 mEq/L    CO2 25  19 - 32 mEq/L    Glucose, Bld 125 (*) 70 - 99 mg/dL    BUN 10  6 - 23 mg/dL    Creatinine, Ser 6.21  0.50 - 1.35 mg/dL    Calcium 8.3 (*) 8.4 - 10.5 mg/dL    Total Protein 5.9 (*) 6.0 - 8.3 g/dL    Albumin 2.4 (*) 3.5 - 5.2 g/dL    AST 27  0 - 37 U/L    ALT 32  0 - 53 U/L    Alkaline Phosphatase 86  39 - 117 U/L    Total Bilirubin 0.3  0.3 - 1.2 mg/dL    GFR calc non Af Amer >90  >90 mL/min    GFR calc Af Amer  >90  >90 mL/min   CBC WITH DIFFERENTIAL     Status: Abnormal   Collection Time   06/25/12  6:05 AM      Component Value Range Comment   WBC 9.9  4.0 - 10.5 K/uL    RBC 3.81 (*) 4.22 - 5.81 MIL/uL    Hemoglobin 10.1 (*) 13.0 - 17.0 g/dL    HCT 30.8 (*) 65.7 - 52.0 %    MCV 81.4  78.0 - 100.0 fL    MCH 26.5  26.0 - 34.0 pg    MCHC 32.6  30.0 - 36.0 g/dL    RDW 84.6  96.2 - 95.2 %    Platelets 339  150 - 400 K/uL    Neutrophils Relative 80 (*) 43 - 77 %    Neutro Abs 7.9 (*) 1.7 - 7.7 K/uL    Lymphocytes Relative 13  12 - 46 %    Lymphs Abs 1.3  0.7 - 4.0 K/uL    Monocytes Relative 6  3 - 12 %    Monocytes Absolute 0.6  0.1 - 1.0 K/uL    Eosinophils Relative 1  0 - 5 %    Eosinophils Absolute 0.1  0.0 - 0.7 K/uL    Basophils Relative 0  0 - 1 %    Basophils Absolute 0.0  0.0 - 0.1 K/uL   TSH     Status: Normal   Collection Time   06/25/12  6:05 AM      Component Value Range Comment   TSH 0.431  0.350 - 4.500 uIU/mL   MAGNESIUM     Status: Normal   Collection Time   06/25/12  6:05 AM      Component Value Range Comment   Magnesium 2.1  1.5 - 2.5 mg/dL   GLUCOSE, CAPILLARY     Status: Abnormal   Collection Time   06/25/12  8:06 AM      Component Value Range Comment   Glucose-Capillary 112 (*) 70 - 99 mg/dL    Comment 1 Notify RN     CARDIAC PANEL(CRET KIN+CKTOT+MB+TROPI)     Status: Normal   Collection Time   06/25/12  8:34 AM      Component Value Range Comment   Total CK 39  7 - 232 U/L    CK, MB 1.3  0.3 - 4.0 ng/mL    Troponin I <0.30  <0.30 ng/mL    Relative Index RELATIVE INDEX IS INVALID  0.0 - 2.5   GLUCOSE, CAPILLARY     Status: Abnormal   Collection Time   06/25/12 11:39 AM  Component Value Range Comment   Glucose-Capillary 114 (*) 70 - 99 mg/dL    Comment 1 Notify RN       MICRO:  IMAGING: Dg Orthopantogram  06/26/2012  *RADIOLOGY REPORT*  Clinical Data: Positive blood cultures, preop valve repair  ORTHOPANTOGRAM/PANORAMIC  Comparison: None.  Findings: No  definite caries, periapical lucency, or dental restorations.  Temporomandibular joints appear seated.  Normal alignment.  IMPRESSION:  1.  Negative.  Recommend dental films for complete evaluation.  Original Report Authenticated By: Osa Craver, M.D.   Dg Chest 2 View  06/26/2012  *RADIOLOGY REPORT*  Clinical Data: Short of breath.  CHEST - 2 VIEW  Comparison: 06/25/2012.  Findings: Diffuse perihilar predominant interstitial and airspace opacities remain present although substantially improved from yesterday's exam.  Differential considerations include pulmonary edema, pulmonary hemorrhage or multifocal pneumonia.  Small bilateral pleural effusions are present, not evident on the prior exam.  IMPRESSION:  1.  Improving bilateral airspace disease most compatible with improving pulmonary edema in the interval since the prior exam. 2.  Development of small bilateral pleural effusions.  Original Report Authenticated By: Andreas Newport, M.D.   Dg Chest Port 1 View  06/25/2012  *RADIOLOGY REPORT*  Clinical Data: Shortness of breath, cough.  PORTABLE CHEST - 1 VIEW  Comparison: 06/08/2012  Findings: Interval development of bilateral airspace opacities. Heart size upper normal to mildly enlarged.  Central vascular congestion.  No definite pleural effusion.  No pneumothorax.  No acute osseous finding.  IMPRESSION: Interval development of bilateral airspace opacities; advanced pulmonary edema versus multifocal pneumonia.  Original Report Authenticated By: Waneta Martins, M.D.    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  1) SBE - MICs noted.  He can be treated with ceftriaxone monotherapy.  I will repeat blood cultures tomorrow in case most recent ones are positive.  He should clear for 48-72 hours prior to placing a PICC line.   -all this dicussed with the patient and his mother, who was at the bedside and all questions answered.

## 2012-06-26 NOTE — Progress Notes (Signed)
Subjective:  C/o mild dyspnea when talking but no severe dyspnea or chest pain.  Objective:  Vital Signs in the last 24 hours: BP 91/50  Pulse 106  Temp 98.3 F (36.8 C) (Oral)  Resp 37  Ht 5\' 6"  (1.676 m)  Wt 68.493 kg (151 lb)  BMI 24.37 kg/m2  SpO2 95%  Physical Exam: Pleasant WM in NAD Lungs:  Rales bases Cardiac:  Rapid, regular rhythm, normal S1 and S2, 3/6  Diastolic murmur,  Summation gallop Abdomen:  Soft, nontender, no masses Extremities:  No edema present  Intake/Output from previous day: 08/02 0701 - 08/03 0700 In: 480 [P.O.:480] Out: 1225 [Urine:1225] Weight Filed Weights   06/24/12 2100 06/25/12 0500 06/26/12 0500  Weight: 72.122 kg (159 lb) 72.122 kg (159 lb) 68.493 kg (151 lb)    Lab Results: Basic Metabolic Panel:  Basename 06/25/12 0605 06/25/12 0012  NA 137 137  K 4.0 3.9  CL 103 101  CO2 25 26  GLUCOSE 125* 119*  BUN 10 10  CREATININE 0.96 0.98    CBC:  Basename 06/25/12 0605 06/25/12 0012  WBC 9.9 9.1  NEUTROABS 7.9* 7.5  HGB 10.1* 10.7*  HCT 31.0* 32.4*  MCV 81.4 82.0  PLT 339 358   Telemetry: Sinus tachycardia  Assessment/Plan:  1. Strip viridans SBE with severe AI 2. 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 Bicuspid AV  Rec:  Extensive discussion with patient and mother and father in room who had extensive questions.  Discussed treatment course and diagnosis with them including options, timing of surgery.  Ideally longer course of antibiotics desirable but has CHF and may not be able to complete full course of treatment prior to surgery and may need surgery earlier if complications.  He will need cath prior to surgery.  Await blood culture results.  Will reconsult ID as Dr. Drue Second mentioned ceftriazone but patient still on vancomycin. Continue IV antibiotics and lasix.     Darden Palmer  MD [facility]  06/26/2012, 10:12 AM

## 2012-06-26 NOTE — Plan of Care (Signed)
Problem: Phase II Progression Outcomes Goal: Progress activity as tolerated unless otherwise ordered Outcome: Progressing Pt up to BR with no increased SOB noted

## 2012-06-27 LAB — BASIC METABOLIC PANEL
Calcium: 8.4 mg/dL (ref 8.4–10.5)
GFR calc Af Amer: >90 mL/min (ref >90)
GFR calc non Af Amer: >90 mL/min (ref >90)
Potassium: 3.8 mEq/L (ref 3.5–5.1)
Sodium: 136 mEq/L (ref 135–145)

## 2012-06-27 LAB — PRO B NATRIURETIC PEPTIDE: Pro B Natriuretic peptide (BNP): 2954 pg/mL — ABNORMAL HIGH (ref 0–125)

## 2012-06-27 LAB — CULTURE, BLOOD (SINGLE)

## 2012-06-27 NOTE — Progress Notes (Signed)
2 Days Post-Op Procedure(s) (LRB): TRANSESOPHAGEAL ECHOCARDIOGRAM (TEE) (N/A) Subjective: Strep viridans  aortic valve endocarditis with bicuspid valve and severe AI Symptomatic pulmonary edema on presentation now improved after Lasix diuresis Blood cultures 3 days ago still negative for growth of streptococcal species  Objective: Vital signs in last 24 hours: Temp:  [98.5 F (36.9 C)-99.5 F (37.5 C)] 98.5 F (36.9 C) (08/04 1400) Pulse Rate:  [92-109] 103  (08/04 1400) Cardiac Rhythm:  [-] Normal sinus rhythm (08/04 0902) Resp:  [16-20] 20  (08/04 1400) BP: (101-125)/(54-69) 115/54 mmHg (08/04 1400) SpO2:  [95 %-99 %] 95 % (08/04 1400) Weight:  [152 lb 5.4 oz (69.1 kg)] 152 lb 5.4 oz (69.1 kg) (08/04 0604)  Hemodynamic parameters for last 24 hours:   sinus tachycardia  Intake/Output from previous day: 08/03 0701 - 08/04 0700 In: 603 [P.O.:600; I.V.:3] Out: 1300 [Urine:1300] Intake/Output this shift:   Afebrile Breathing more comfortably today Diastolic murmur and S3 gallop audible, unchanged Peripheral pulse width classic waterhammer characteristic   Lab Results:  Basename 06/25/12 0605 06/25/12 0012  WBC 9.9 9.1  HGB 10.1* 10.7*  HCT 31.0* 32.4*  PLT 339 358   BMET:  Basename 06/27/12 0643 06/25/12 0605  NA 136 137  K 3.8 4.0  CL 100 103  CO2 27 25  GLUCOSE 98 125*  BUN 11 10  CREATININE 0.99 0.96  CALCIUM 8.4 8.3*    PT/INR: No results found for this basename: LABPROT,INR in the last 72 hours ABG No results found for this basename: phart, pco2, po2, hco3, tco2, acidbasedef, o2sat   CBG (last 3)   Basename 06/25/12 1139 06/25/12 0806  GLUCAP 114* 112*    Assessment/Plan: S/P Procedure(s) (LRB): TRANSESOPHAGEAL ECHOCARDIOGRAM (TEE) (N/A) Aortic valve endocarditis with a bicuspid native valve and severe AI Plans are being made at the patient's request for transfer to [facility] in [place] for surgical therapy by Dr. Bevelyn Ngo,  professor of  cardiothoracic surgery [facility] [place] Disc of transesophageal echo cardiogram will be provided to patient at the time of transfer.  LOS: 3 days    VAN TRIGT III,Celeste Candelas 06/27/2012

## 2012-06-27 NOTE — Progress Notes (Signed)
[facility] Progress Note  06/27/2012  Subjective: Pt says that he is feeling fine today.  He has been able to ambulate some and has no shortness of breath or CP.  No fever or chills.  He is likely going to [facility] to have his valve replacement.  This is where his family is located.     Objective:  Vital signs in last 24 hours: Filed Vitals:   06/26/12 0500 06/26/12 1430 06/26/12 2100 06/27/12 0604  BP: 91/50 113/61 125/69 101/63  Pulse: 106 103 109 92  Temp: 98.3 F (36.8 C) 98.6 F (37 C) 99.5 F (37.5 C) 99.4 F (37.4 C)  TempSrc: Oral Oral    Resp:   18 16  Height:      Weight: 68.493 kg (151 lb)   69.1 kg (152 lb 5.4 oz)  SpO2: 95% 95% 95% 99%   Weight change: 0.607 kg (1 lb 5.4 oz)  Intake/Output Summary (Last 24 hours) at 06/27/12 1125 Last data filed at 06/27/12 0500  Gross per 24 hour  Intake    480 ml  Output   1300 ml  Net   -820 ml   Lab Results  Component Value Date   HGBA1C 5.6 06/26/2012   Lab Results  Component Value Date   CREATININE 0.99 06/27/2012    Review of Systems As above, otherwise all reviewed and reported negative  Physical Exam General - awake, no distress, cooperative HEENT - NCAT, MMM Lungs - BBS, bibasilar crackles CV - normal s1, s2 sounds with diastolic murmur  Abd - soft, nondistended, no masses, nontender Ext - no C/C/E  Lab Results: Results for orders placed during the hospital encounter of 06/24/12 (from the past 24 hour(s))  PRO B NATRIURETIC PEPTIDE     Status: Abnormal   Collection Time   06/27/12  6:43 AM      Component Value Range   Pro B Natriuretic peptide (BNP) 2954.0 (*) 0 - 125 pg/mL  BASIC METABOLIC PANEL     Status: Normal   Collection Time   06/27/12  6:43 AM      Component Value Range   Sodium 136  135 - 145 mEq/L   Potassium 3.8  3.5 - 5.1 mEq/L   Chloride 100  96 - 112 mEq/L   CO2 27  19 - 32 mEq/L   Glucose, Bld 98  70 - 99 mg/dL   BUN 11  6 - 23 mg/dL   Creatinine, Ser 1.91  0.50 - 1.35 mg/dL   Calcium 8.4  8.4 - 47.8 mg/dL   GFR calc non Af Amer >90  >90 mL/min   GFR calc Af Amer >90  >90 mL/min    Micro Results: Recent Results (from the past 240 hour(s))  CULTURE, BLOOD (SINGLE)     Status: Normal (Preliminary result)   Collection Time   06/24/12  3:24 PM      Component Value Range Status Comment   Preliminary Report STREPTOCOCCUS SPECIES   Preliminary   CULTURE, BLOOD (SINGLE)     Status: Normal (Preliminary result)   Collection Time   06/24/12  3:24 PM      Component Value Range Status Comment   Preliminary Report STREPTOCOCCUS SPECIES   Preliminary   CULTURE, BLOOD (ROUTINE X 2)     Status: Normal (Preliminary result)   Collection Time   06/25/12 12:12 AM      Component Value Range Status Comment   Specimen Description BLOOD RIGHT ARM   Final  Special Requests BOTTLES DRAWN AEROBIC AND ANAEROBIC [facility] EACH   Final    Culture  Setup Time 06/25/2012 09:00   Final    Culture     Final    Value:        BLOOD CULTURE RECEIVED NO GROWTH TO DATE CULTURE WILL BE HELD FOR 5 DAYS BEFORE ISSUING A FINAL NEGATIVE REPORT   Report Status PENDING   Incomplete   CULTURE, BLOOD (ROUTINE X 2)     Status: Normal (Preliminary result)   Collection Time   06/25/12 12:13 AM      Component Value Range Status Comment   Specimen Description BLOOD RIGHT HAND   Final    Special Requests BOTTLES DRAWN AEROBIC AND ANAEROBIC 5CC EACH   Final    Culture  Setup Time 06/25/2012 09:00   Final    Culture     Final    Value:        BLOOD CULTURE RECEIVED NO GROWTH TO DATE CULTURE WILL BE HELD FOR 5 DAYS BEFORE ISSUING A FINAL NEGATIVE REPORT   Report Status PENDING   Incomplete     Medications:  Scheduled Meds:   . cefTRIAXone (ROCEPHIN)  IV  2 g Intravenous Q24H  . furosemide  40 mg Oral Daily  . sodium chloride  3 mL Intravenous Q12H   Continuous Infusions:  PRN Meds:.acetaminophen, acetaminophen, ondansetron (ZOFRAN) IV, ondansetron  Assessment/Plan: Infective endocarditis with strep viridans  bacteremia and severe aortic insufficiency - as per Dr. Comer/Sniders recommendations patient has been started on ceftriaxone IV every 24 hours to treat for strep viridans. At this time we will be continuing his Lasix as prescribed by Dr. Eldridge Dace.  8/1 BC have been positive. 8/2 BC no growth to date.   Severe Aortic Insufficiency - pt discussing valve replacement with CT surgery   CHF - continue lasix, symptoms resolved now  Fluid Overload/SOB - symptoms resolved now,  continue lasix per cardiology, monitor I/Os, supplemental oxygen as needed.    LOS: 3 days   Shalaine Payson 06/27/2012, 11:25 AM  Cleora Fleet, MD, CDE, FAAFP [facility] [place], [place]  [phone]

## 2012-06-27 NOTE — Progress Notes (Signed)
Subjective:  No severe dyspnea or chest pain. Has decided that he would like to have work done in [place].  Objective:  Vital Signs in the last 24 hours: BP 101/63  Pulse 92  Temp 99.4 F (37.4 C) (Oral)  Resp 16  Ht 5\' 6"  (1.676 m)  Wt 69.1 kg (152 lb 5.4 oz)  BMI 24.59 kg/m2  SpO2 99%  Physical Exam: Pleasant WM in NAD Lungs:  Rales bases Cardiac:  Rapid, regular rhythm, normal S1 and S2, 3/6  Diastolic murmur,  Summation gallop Abdomen:  Soft, nontender, no masses Extremities:  No edema present  Intake/Output from previous day: 08/03 0701 - 08/04 0700 In: 603 [P.O.:600; I.V.:3] Out: 1300 [Urine:1300] Weight Filed Weights   06/25/12 0500 06/26/12 0500 06/27/12 0604  Weight: 72.122 kg (159 lb) 68.493 kg (151 lb) 69.1 kg (152 lb 5.4 oz)    Lab Results: Basic Metabolic Panel:  Basename 06/27/12 0643 06/25/12 0605  NA 136 137  K 3.8 4.0  CL 100 103  CO2 27 25  GLUCOSE 98 125*  BUN 11 10  CREATININE 0.99 0.96    CBC:  Basename 06/25/12 0605 06/25/12 0012  WBC 9.9 9.1  NEUTROABS 7.9* 7.5  HGB 10.1* 10.7*  HCT 31.0* 32.4*  MCV 81.4 82.0  PLT 339 358   Telemetry: Sinus tachycardia  Assessment/Plan:  1. Strip viridans SBE with severe AI 2. 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 Bicuspid AV  Rec:  Extensive discussion with patient about timing of surgery which because of CHF will need to be sooner rather than later once blood cultures return.   He wants to go to [facility] to have work done because of family support..  Will get case manager involved to help with arrangements at home and for transfer.  He was changed to ceftriaxone yesterday by ID.    Darden Palmer  MD [facility] Card[facility]  06/27/2012, 9:51 AM

## 2012-06-28 LAB — BASIC METABOLIC PANEL
BUN: 12 mg/dL (ref 6–23)
Chloride: 98 mEq/L (ref 96–112)
GFR calc Af Amer: >90 mL/min (ref >90)
GFR calc non Af Amer: >90 mL/min (ref >90)
Potassium: 3.7 mEq/L (ref 3.5–5.1)
Sodium: 134 mEq/L — ABNORMAL LOW (ref 135–145)

## 2012-06-28 MED ORDER — DEXTROSE 5 % IV SOLN: 2.0000 g | INTRAVENOUS | Status: DC

## 2012-06-28 MED ORDER — SODIUM CHLORIDE 0.9 % IJ SOLN: 3.0000 mL | Freq: Two times a day (BID) | INTRAMUSCULAR | Status: DC

## 2012-06-28 MED ORDER — FUROSEMIDE 20 MG PO TABS: 40.0000 mg | ORAL_TABLET | Freq: Every day | ORAL | Status: DC

## 2012-06-28 MED ORDER — ONDANSETRON HCL 4 MG/2ML IJ SOLN: 4.0000 mg | Freq: Four times a day (QID) | INTRAMUSCULAR | Status: DC | PRN

## 2012-06-28 NOTE — Progress Notes (Addendum)
TC from [facility] unable to transport today . TC to [facility] they report pt must be sent today or will lose bed and they do not have any transfer program in place transfer arrangements must be made from our hospital, but they will except the pt. Even in the middle of the night. TC to [facility] requesting transport for later in evening, they report unable to do so. TC to [employer] for request to transport, faxed facesheet as requested. TC from [facility] [employer] requesting ICD codes. Dr. Laural Benes provided codes needed for [facility] [employer]. TC from [facility] [employer] requesting clinicals to be faxed to [facility]. TC to case manager she reports she will fax clinicals to ins co. TC from [facility], requesting update explained waiting on ins. Co. Approval . TC from [facility] requesting update for tomorrow am. TC from [employer] reporting approval pending and that ins. Co. Reported they did not have clinicals nor the request for the pt to go to [facility]. TC to Case manager who reports she faxed the needed information. TC from [facility] has updated Dr. Imogene Burn and The on call case manager. We will lose the bed tonite and will need f/u early am for bed placement. I have called [facility] and they will plan for pickup 7:30-8am I have instructed them to call prior to pick up to ensure bed availability. [facility] Case Management Odetta Pink or Malena Catholic will need to be called to ensure bed is available prior to [facility] arrival. Text to Dr. Laural Benes to notify pt did not leave as planned. TC to Dr. Anne Fu to inform Dr. Eldridge Dace pt. Did not leave yet. Pt. And family have been kept informed as the day has progressed.

## 2012-06-28 NOTE — Discharge Summary (Signed)
Patient ID: Austin Charles MRN: [id number] DOB/AGE: 1981-07-12 31 y.o.  Admit date: 06/24/2012 Discharge date: 06/28/2012  Primary Discharge Diagnosis Aortic valve endocarditis Secondary Discharge Diagnosissevere aortic regurgitation, congestive heart failure-acute systolic  Significant Diagnostic Studies: cardiac graphics: TEE 06/25/12; blood cultures  Consults: Infectious disease, Cardiac surgery  Hospital Course: 31 y/o man who was admitted due to severe AI and abnormal aortic valve after outpatient blood cultures grew GPC.  This turned out to be Strep viridans.  His antibiotics were narrowed to Ceftriaxone after receiving ampicillin, gentamicin and vancomycin initially.  Shortness of breath was controlled with Lasix.  He underwent TEE showing a 1 cm aortic valve vegetation and severe Aortic insufficiency.  Cardiac surgery was consulted and surgery was recommended.  Due to his family being in [place], he preferred to have surgery at [facility].  Transfer was arranged.  He has remained afebrile and BP has been stable.  He has been tachycardic during his stay.  The family was to decide whether he would go to [facility] by ambulance or by car.     Discharge Exam: Blood pressure 105/62, pulse 96, temperature 98.6 F (37 C), temperature source Oral, resp. rate 16, height 5\' 6"  (1.676 m), weight 69 kg (152 lb 1.9 oz), SpO2 97.00%.   [place]/AT Tachycardic; sytolic murmur, diastolic murmur Scant basilar crackles Soft NT No edema  Labs:   Lab Results  Component Value Date   WBC 9.9 06/25/2012   HGB 10.1* 06/25/2012   HCT 31.0* 06/25/2012   MCV 81.4 06/25/2012   PLT 339 06/25/2012    Lab 06/28/12 0550 06/25/12 0605  NA 134* --  K 3.7 --  CL 98 --  CO2 27 --  BUN 12 --  CREATININE 0.91 --  CALCIUM 8.5 --  PROT -- 5.9*  BILITOT -- 0.3  ALKPHOS -- 86  ALT -- 32  AST -- 27  GLUCOSE 90 --   Lab Results  Component Value Date   CKTOTAL 39 06/25/2012   CKMB 1.3 06/25/2012   TROPONINI <0.30 06/25/2012    No  results found for this basename: CHOL   No results found for this basename: HDL   No results found for this basename: LDLCALC   No results found for this basename: TRIG   No results found for this basename: CHOLHDL   No results found for this basename: LDLDIRECT      Radiology:pulmonary edema, improving MVH:QIONG tachycardia; no ST segment changes  FOLLOW UP PLANS AND APPOINTMENTS Discharge Orders    Future Appointments: Provider: Department: Dept Phone: Center:   07/08/2012 10:15 AM Judyann Munson, MD [facility] [phone] [facility]     Medication List  As of 06/28/2012 10:31 AM   TAKE these medications         beclomethasone 40 MCG/ACT inhaler   Commonly known as: QVAR   Inhale 1 puff into the lungs 2 (two) times daily.      dextrose 5 % SOLN 50 mL with cefTRIAXone 2 G SOLR 2 g   Inject 2 g into the vein daily.      furosemide 20 MG tablet   Commonly known as: LASIX   Take 2 tablets (40 mg total) by mouth daily.      ondansetron 4 MG/2ML Soln injection   Commonly known as: ZOFRAN   Inject 2 mLs (4 mg total) into the vein every 6 (six) hours as needed for nausea.      sodium chloride 0.9 % injection   Inject 3  mLs into the vein every 12 (twelve) hours.             BRING ALL MEDICATIONS WITH YOU TO FOLLOW UP APPOINTMENTS  Time spent with patient to include physician time:45 minutes; 30 minutes spent with the family answering questions about transport, further tests,operative options and followup care Signed: Binh Doten S. 06/28/2012, 10:31 AM

## 2012-06-28 NOTE — Plan of Care (Signed)
Problem: Phase II Progression Outcomes Goal: Discharge plan established Outcome: Completed/Met Date Met:  06/28/12 Pt to be transferred to [facility] Dr. Bevelyn Ngo will be accepting MD. Report given to Odetta Pink @ [phone]. Pt to go to B[facility] at [facility], address is [address] [place], [place], [place] [address]

## 2012-06-28 NOTE — Progress Notes (Signed)
[facility]    Date of Admission:  06/24/2012   Total days of antibiotics: 5        Day 3: Ceftriaxone                 Principal Problem:  *Endocarditis Active Problems:  Shortness of breath  Aortic regurgitation  Aortic valve insufficiency  Streptococcal endocarditis  CHF (congestive heart failure)      . cefTRIAXone (ROCEPHIN)  IV  2 g Intravenous Q24H  . furosemide  40 mg Oral Daily  . sodium chloride  3 mL Intravenous Q12H    Subjective Pt is feeling better. Pt denies any new symptoms.  Objective: Temp:  [98.4 F (36.9 C)-98.6 F (37 C)] 98.6 F (37 C) (08/05 0500) Pulse Rate:  [96-109] 96  (08/05 0500) Resp:  [16-20] 16  (08/05 0500) BP: (105-120)/(54-65) 105/62 mmHg (08/05 0500) SpO2:  [95 %-97 %] 97 % (08/05 0500) Weight:  [69 kg (152 lb 1.9 oz)] 69 kg (152 lb 1.9 oz) (08/05 0500)  General: Alert and oriented X 3,NAD  Skin: No rash  Lungs: clear to auscultation bilaterally Cor: RRR and systolic and diastolic murmur noted Abdomen: soft, non-tender; bowel sounds normal; no masses, no organomegaly  Ext: No clubbing, cyanosis or edema  Lab Results Lab Results  Component Value Date   WBC 9.9 06/25/2012   HGB 10.1* 06/25/2012   HCT 31.0* 06/25/2012   MCV 81.4 06/25/2012   PLT 339 06/25/2012    Lab Results  Component Value Date   CREATININE 0.91 06/28/2012   BUN 12 06/28/2012   NA 134* 06/28/2012   K 3.7 06/28/2012   CL 98 06/28/2012   CO2 27 06/28/2012    Lab Results  Component Value Date   ALT 32 06/25/2012   AST 27 06/25/2012   ALKPHOS 86 06/25/2012   BILITOT 0.3 06/25/2012   Microbiology: Recent Results (from the past 240 hour(s))  CULTURE, BLOOD (SINGLE)     Status: Normal   Collection Time   06/24/12  3:24 PM      Component Value Range Status Comment   Organism ID, Bacteria MICROAEROPHILIC STREPTOCOCCI   Final   CULTURE, BLOOD (SINGLE)     Status: Normal   Collection Time   06/24/12  3:24 PM      Component Value Range Status Comment   Organism ID, Bacteria MICROAEROPHILIC STREPTOCOCCI   Final   CULTURE, BLOOD (ROUTINE X 2)     Status: Normal (Preliminary result)   Collection Time   06/25/12 12:12 AM      Component Value Range Status Comment   Specimen Description BLOOD RIGHT ARM   Final    Special Requests BOTTLES DRAWN AEROBIC AND ANAEROBIC [facility] EACH   Final    Culture  Setup Time 06/25/2012 09:00   Final    Culture     Final    Value:        BLOOD CULTURE RECEIVED NO GROWTH TO DATE CULTURE WILL BE HELD FOR 5 DAYS BEFORE ISSUING A FINAL NEGATIVE REPORT   Report Status PENDING   Incomplete   CULTURE, BLOOD (ROUTINE X 2)     Status: Normal (Preliminary result)   Collection Time   06/25/12 12:13 AM      Component Value Range Status Comment   Specimen Description BLOOD RIGHT HAND   Final    Special Requests BOTTLES DRAWN AEROBIC AND ANAEROBIC [facility] EACH   Final    Culture  Setup  Time 06/25/2012 09:00   Final    Culture     Final    Value:        BLOOD CULTURE RECEIVED NO GROWTH TO DATE CULTURE WILL BE HELD FOR 5 DAYS BEFORE ISSUING A FINAL NEGATIVE REPORT   Report Status PENDING   Incomplete    Assessment: 31 y.o M  with severe AI and MR developed Sub 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 Initially pt was treated with ampicillin, gentamicin and vancomycin and later narrowed down to Ceftriaxone. Pt is feeling better and ready to go to [facility] for further treatment at [facility].    Plan Continue Ceftriaxone   Junious Silk 06/28/2012, 11:38 AM     Infectious Diseases Attending Addendem: Date: 06/28/2012  Patient name: Austin Charles  Medical record number: [id number]  Date of birth: [birth year]    This patient has been seen and discussed with the house staff. Please see their note for complete details. I concur with their findings with the following additions/corrections:  Repeat blood cultures are clear and he is doing well on rocephin. Patient scheduled to leave with transport for [facility]  for CVTS surgery. Discussed plan with patient and his mother.  Paulette Blanch Dam 06/28/2012, 5:30 PM

## 2012-06-28 NOTE — Progress Notes (Signed)
[facility] Progress Note  06/28/2012   Subjective: Pt without any complaints, says he is ready for transfer to [facility], bed is ready for him.   Objective:  Vital signs in last 24 hours: Filed Vitals:   06/27/12 0604 06/27/12 1400 06/27/12 2100 06/28/12 0500  BP: 101/63 115/54 120/65 105/62  Pulse: 92 103 109 96  Temp: 99.4 F (37.4 C) 98.5 F (36.9 C) 98.4 F (36.9 C) 98.6 F (37 C)  TempSrc:  Oral    Resp: 16 20 18 16   Height:      Weight: 69.1 kg (152 lb 5.4 oz)   69 kg (152 lb 1.9 oz)  SpO2: 99% 95% 95% 97%   Weight change: -0.1 kg (-3.5 oz)  Intake/Output Summary (Last 24 hours) at 06/28/12 1052 Last data filed at 06/28/12 0500  Gross per 24 hour  Intake   1320 ml  Output   1900 ml  Net   -580 ml   Lab Results  Component Value Date   HGBA1C 5.6 06/26/2012   Lab Results  Component Value Date   CREATININE 0.91 06/28/2012    Review of Systems As above, otherwise all reviewed and reported negative  Physical Exam General - awake, no distress, cooperative  HEENT - NCAT, MMM Lungs - BBS  CV - normal s1, s2 sounds with diastolic murmur  Abd - soft, nondistended, no masses, nontender  Ext - no C/C/E  Lab Results: Results for orders placed during the hospital encounter of 06/24/12 (from the past 24 hour(s))  BASIC METABOLIC PANEL     Status: Abnormal   Collection Time   06/28/12  5:50 AM      Component Value Range   Sodium 134 (*) 135 - 145 mEq/L   Potassium 3.7  3.5 - 5.1 mEq/L   Chloride 98  96 - 112 mEq/L   CO2 27  19 - 32 mEq/L   Glucose, Bld 90  70 - 99 mg/dL   BUN 12  6 - 23 mg/dL   Creatinine, Ser 1.61  0.50 - 1.35 mg/dL   Calcium 8.5  8.4 - 09.6 mg/dL   GFR calc non Af Amer >90  >90 mL/min   GFR calc Af Amer >90  >90 mL/min    Micro Results: Recent Results (from the past 240 hour(s))  CULTURE, BLOOD (SINGLE)     Status: Normal   Collection Time   06/24/12  3:24 PM      Component Value Range Status Comment   Organism ID, Bacteria  MICROAEROPHILIC STREPTOCOCCI   Final   CULTURE, BLOOD (SINGLE)     Status: Normal   Collection Time   06/24/12  3:24 PM      Component Value Range Status Comment   Organism ID, Bacteria MICROAEROPHILIC STREPTOCOCCI   Final   CULTURE, BLOOD (ROUTINE X 2)     Status: Normal (Preliminary result)   Collection Time   06/25/12 12:12 AM      Component Value Range Status Comment   Specimen Description BLOOD RIGHT ARM   Final    Special Requests BOTTLES DRAWN AEROBIC AND ANAEROBIC [facility] EACH   Final    Culture  Setup Time 06/25/2012 09:00   Final    Culture     Final    Value:        BLOOD CULTURE RECEIVED NO GROWTH TO DATE CULTURE WILL BE HELD FOR 5 DAYS BEFORE ISSUING A FINAL NEGATIVE REPORT   Report Status PENDING   Incomplete  CULTURE, BLOOD (ROUTINE X 2)     Status: Normal (Preliminary result)   Collection Time   06/25/12 12:13 AM      Component Value Range Status Comment   Specimen Description BLOOD RIGHT HAND   Final    Special Requests BOTTLES DRAWN AEROBIC AND ANAEROBIC 5CC EACH   Final    Culture  Setup Time 06/25/2012 09:00   Final    Culture     Final    Value:        BLOOD CULTURE RECEIVED NO GROWTH TO DATE CULTURE WILL BE HELD FOR 5 DAYS BEFORE ISSUING A FINAL NEGATIVE REPORT   Report Status PENDING   Incomplete     Medications:  Scheduled Meds:   . cefTRIAXone (ROCEPHIN)  IV  2 g Intravenous Q24H  . furosemide  40 mg Oral Daily  . 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

## 2012-06-29 ENCOUNTER — Encounter (HOSPITAL_COMMUNITY): Payer: Self-pay | Admitting: Interventional Cardiology

## 2012-06-29 LAB — BASIC METABOLIC PANEL
CO2: 27 mEq/L (ref 19–32)
Calcium: 8.6 mg/dL (ref 8.4–10.5)
GFR calc non Af Amer: >90 mL/min (ref >90)
Sodium: 138 mEq/L (ref 135–145)

## 2012-06-29 NOTE — Plan of Care (Signed)
Problem: Phase III Progression Outcomes Goal: IV/normal saline lock discontinued Outcome: Not Applicable Date Met:  06/29/12 Pt being transferred to [facility] via [facility], IV left in patient in case of emergency during transport.

## 2012-06-29 NOTE — Progress Notes (Signed)
Patient transported to [facility] in [place], [place] via [place].  Report on patient called to [facility]ler, RN @ [phone].

## 2012-06-29 NOTE — Progress Notes (Signed)
A number was provided for the TC at [facility] in [place], [place]. The number is [phone]. Will pass along to next shift RN. Sanda Linger

## 2012-06-29 NOTE — Progress Notes (Signed)
Spoke with someone from [facility] in [place] about pt's bed. Informed that there was not a bed available but someone would contact us to let us know if there was a bed available as soon as possible. Gave report on pt and current VS. Pt informed of this. Will await call,keep pt informed& monitor the pt. Sanda Linger

## 2012-07-01 LAB — CULTURE, BLOOD (ROUTINE X 2)

## 2012-07-08 ENCOUNTER — Ambulatory Visit: Payer: BC Managed Care – PPO | Admitting: Internal Medicine

## 2012-09-23 ENCOUNTER — Encounter (HOSPITAL_COMMUNITY)
Admission: RE | Admit: 2012-09-23 | Discharge: 2012-09-23 | Disposition: A | Discharge status: Home / Self Care | Payer: BC Managed Care – PPO | Source: Ambulatory Visit | Attending: Cardiology | Admitting: Cardiology

## 2012-09-23 NOTE — Progress Notes (Signed)
Cardiac Rehab Medication Review by a Pharmacist  Does the patient  feel that his/her medications are working for him/her? yes  Has the patient been experiencing any side effects to the medications prescribed? no  Does the patient measure his/her own blood pressure or blood glucose at home?  yes , daily  Does the patient have any problems obtaining medications due to transportation or finances?   no  Understanding of regimen: excellent Understanding of indications: excellent Potential of compliance: good  Pharmacist comments: The patients medication list was assessed for accuracy, indications, adverse effects, and compliance. Any questions were addressed at this time. The patient takes very few medications and understands them well.  Abran Duke, PharmD Clinical Pharmacist Phone: [phone] Pager: [phone] 09/23/2012 8:24 AM

## 2012-09-27 ENCOUNTER — Encounter (HOSPITAL_COMMUNITY)
Admission: RE | Admit: 2012-09-27 | Discharge: 2012-09-27 | Disposition: A | Discharge status: Home / Self Care | Payer: BC Managed Care – PPO | Source: Ambulatory Visit | Attending: Cardiology | Admitting: Cardiology

## 2012-09-27 ENCOUNTER — Encounter (HOSPITAL_COMMUNITY): Payer: Self-pay

## 2012-09-27 DIAGNOSIS — I359 Nonrheumatic aortic valve disorder, unspecified: Secondary | ICD-10-CM | POA: Insufficient documentation

## 2012-09-27 DIAGNOSIS — R011 Cardiac murmur, unspecified: Secondary | ICD-10-CM | POA: Insufficient documentation

## 2012-09-27 DIAGNOSIS — R0602 Shortness of breath: Secondary | ICD-10-CM | POA: Insufficient documentation

## 2012-09-27 DIAGNOSIS — I509 Heart failure, unspecified: Secondary | ICD-10-CM | POA: Insufficient documentation

## 2012-09-27 DIAGNOSIS — I38 Endocarditis, valve unspecified: Secondary | ICD-10-CM | POA: Insufficient documentation

## 2012-09-27 NOTE — Progress Notes (Signed)
Pt started cardiac rehab today.  Pt tolerated light exercise without difficulty.   Asymptomatic.   VSS, telemetry-NSR.  Pt oriented to exercise equipment and routine.  Understanding verbalized. 

## 2012-09-29 ENCOUNTER — Encounter (HOSPITAL_COMMUNITY)
Admission: RE | Admit: 2012-09-29 | Discharge: 2012-09-29 | Disposition: A | Discharge status: Home / Self Care | Payer: BC Managed Care – PPO | Source: Ambulatory Visit | Attending: Cardiology | Admitting: Cardiology

## 2012-09-29 NOTE — Progress Notes (Signed)
Reviewed quality of life questionnaire with patient.  Overall pt has good quality of life however there are a few areas that are concerning to him.  His energy for every day activities causes the most concern.  However pt states he is overall able to do every day activities quite well but has noticed a difference post op in his functional capacity.  Pt encouraged to stay in communication with RN about his symptoms, primarily if they worsen or are unimproved with cardiac rehab.  Pt also feels some job strain financially and demands of responsibilities.  However pt feels his work in rewarding and enjoys it.  Pt overall has excellent coping skills.

## 2012-10-01 ENCOUNTER — Encounter (HOSPITAL_COMMUNITY)
Admission: RE | Admit: 2012-10-01 | Discharge: 2012-10-01 | Disposition: A | Discharge status: Home / Self Care | Payer: BC Managed Care – PPO | Source: Ambulatory Visit | Attending: Cardiology | Admitting: Cardiology

## 2012-10-01 NOTE — Progress Notes (Signed)
Pt reports his blood pressure at home has been 130's systolic this week.  Pt has notified his cardiologist of this finding, with no new orders.  Pt does report he has taken sudafed this week. Pt cautioned against using sudafed products ("drines") and encouraged to talk to pharmacist for safe alternatives such as corcidin or benadryl.  BP-168/80 during high workloads with bike test, BP-normalized with decreased workload and peak exercise BP-124/62 with normal exercise workload conditions.  Heart sounds regular rate and rhythm, no murmurs.  Pt will continue current regimen.

## 2012-10-04 ENCOUNTER — Encounter (HOSPITAL_COMMUNITY)
Admission: RE | Admit: 2012-10-04 | Discharge: 2012-10-04 | Disposition: A | Discharge status: Home / Self Care | Payer: BC Managed Care – PPO | Source: Ambulatory Visit | Attending: Cardiology | Admitting: Cardiology

## 2012-10-04 NOTE — Progress Notes (Signed)
Reviewed home exercise with pt today.  Pt plans to walk and workout at the gym at his apartment complex for exercise.  Reviewed THR, pulse, RPE, sign and symptoms, and when to call 911 or MD.  Pt voiced understanding. Fabio Pierce, MA, ACSM RCEP

## 2012-10-04 NOTE — Progress Notes (Signed)
agree

## 2012-10-06 ENCOUNTER — Telehealth (HOSPITAL_COMMUNITY): Payer: Self-pay | Admitting: Cardiac Rehabilitation

## 2012-10-06 ENCOUNTER — Encounter (HOSPITAL_COMMUNITY): Payer: BC Managed Care – PPO

## 2012-10-06 NOTE — Telephone Encounter (Signed)
Pt absent from cardiac rehab today for cold symptoms

## 2012-10-08 ENCOUNTER — Encounter (HOSPITAL_COMMUNITY)
Admission: RE | Admit: 2012-10-08 | Discharge: 2012-10-08 | Disposition: A | Discharge status: Home / Self Care | Payer: BC Managed Care – PPO | Source: Ambulatory Visit | Attending: Cardiology | Admitting: Cardiology

## 2012-10-11 ENCOUNTER — Encounter (HOSPITAL_COMMUNITY)
Admission: RE | Admit: 2012-10-11 | Discharge: 2012-10-11 | Disposition: A | Discharge status: Home / Self Care | Payer: BC Managed Care – PPO | Source: Ambulatory Visit | Attending: Cardiology | Admitting: Cardiology

## 2012-10-11 NOTE — Progress Notes (Signed)
Pt had 2 kb weight gain today. Pt reports he did eat out this weekend.  Pt denies swelling or edema today, however admits to some pedal edema over the weekend that resolved with elevation.  Pt lungs clear, denies dyspnea.  Pt advised to continue to monitor edema and notify cardiologist should it become persistent.  Pt has recently increased amlodipine to 10mg  daily for blood pressure control.  Will continue to monitor.

## 2012-10-13 ENCOUNTER — Encounter (HOSPITAL_COMMUNITY)
Admission: RE | Admit: 2012-10-13 | Discharge: 2012-10-13 | Disposition: A | Discharge status: Home / Self Care | Payer: BC Managed Care – PPO | Source: Ambulatory Visit | Attending: Cardiology | Admitting: Cardiology

## 2012-10-15 ENCOUNTER — Encounter (HOSPITAL_COMMUNITY): Payer: BC Managed Care – PPO

## 2012-10-18 ENCOUNTER — Encounter (HOSPITAL_COMMUNITY)
Admission: RE | Admit: 2012-10-18 | Discharge: 2012-10-18 | Disposition: A | Discharge status: Home / Self Care | Payer: BC Managed Care – PPO | Source: Ambulatory Visit | Attending: Cardiology | Admitting: Cardiology

## 2012-10-20 ENCOUNTER — Encounter (HOSPITAL_COMMUNITY): Payer: BC Managed Care – PPO

## 2012-10-22 ENCOUNTER — Encounter (HOSPITAL_COMMUNITY): Payer: BC Managed Care – PPO

## 2012-10-25 ENCOUNTER — Encounter (HOSPITAL_COMMUNITY): Payer: BC Managed Care – PPO

## 2012-10-27 ENCOUNTER — Encounter (HOSPITAL_COMMUNITY): Payer: BC Managed Care – PPO

## 2012-10-29 ENCOUNTER — Encounter (HOSPITAL_COMMUNITY)
Admission: RE | Admit: 2012-10-29 | Discharge: 2012-10-29 | Disposition: A | Discharge status: Home / Self Care | Payer: BC Managed Care – PPO | Source: Ambulatory Visit | Attending: Cardiology | Admitting: Cardiology

## 2012-10-29 DIAGNOSIS — I359 Nonrheumatic aortic valve disorder, unspecified: Secondary | ICD-10-CM | POA: Insufficient documentation

## 2012-10-29 DIAGNOSIS — R0602 Shortness of breath: Secondary | ICD-10-CM | POA: Insufficient documentation

## 2012-10-29 DIAGNOSIS — I38 Endocarditis, valve unspecified: Secondary | ICD-10-CM | POA: Insufficient documentation

## 2012-10-29 DIAGNOSIS — R011 Cardiac murmur, unspecified: Secondary | ICD-10-CM | POA: Insufficient documentation

## 2012-10-29 DIAGNOSIS — I509 Heart failure, unspecified: Secondary | ICD-10-CM | POA: Insufficient documentation

## 2012-11-01 ENCOUNTER — Encounter (HOSPITAL_COMMUNITY): Payer: BC Managed Care – PPO

## 2012-11-03 ENCOUNTER — Encounter (HOSPITAL_COMMUNITY): Payer: BC Managed Care – PPO

## 2012-11-05 ENCOUNTER — Encounter (HOSPITAL_COMMUNITY): Payer: BC Managed Care – PPO

## 2012-11-08 ENCOUNTER — Encounter (HOSPITAL_COMMUNITY): Payer: BC Managed Care – PPO

## 2012-11-10 ENCOUNTER — Encounter (HOSPITAL_COMMUNITY): Payer: BC Managed Care – PPO

## 2012-11-10 ENCOUNTER — Telehealth (HOSPITAL_COMMUNITY): Payer: Self-pay | Admitting: Cardiac Rehabilitation

## 2012-11-10 NOTE — Telephone Encounter (Signed)
pc to assess reason for extended absence from  Cardiac rehab.  Lm on vm.

## 2012-11-12 ENCOUNTER — Encounter (HOSPITAL_COMMUNITY): Payer: BC Managed Care – PPO

## 2012-11-15 ENCOUNTER — Encounter (HOSPITAL_COMMUNITY): Payer: BC Managed Care – PPO

## 2012-11-19 ENCOUNTER — Encounter (HOSPITAL_COMMUNITY): Payer: BC Managed Care – PPO

## 2012-11-22 ENCOUNTER — Encounter (HOSPITAL_COMMUNITY)
Admission: RE | Admit: 2012-11-22 | Discharge: 2012-11-22 | Disposition: A | Discharge status: Home / Self Care | Payer: BC Managed Care – PPO | Source: Ambulatory Visit | Attending: Cardiology | Admitting: Cardiology

## 2012-11-24 ENCOUNTER — Encounter (HOSPITAL_COMMUNITY): Payer: BC Managed Care – PPO

## 2012-11-26 ENCOUNTER — Encounter (HOSPITAL_COMMUNITY): Payer: BC Managed Care – PPO

## 2012-11-29 ENCOUNTER — Encounter (HOSPITAL_COMMUNITY): Payer: BC Managed Care – PPO

## 2012-12-01 ENCOUNTER — Encounter (HOSPITAL_COMMUNITY): Payer: BC Managed Care – PPO

## 2012-12-03 ENCOUNTER — Encounter (HOSPITAL_COMMUNITY): Payer: BC Managed Care – PPO

## 2012-12-06 ENCOUNTER — Encounter (HOSPITAL_COMMUNITY): Payer: BC Managed Care – PPO

## 2012-12-08 ENCOUNTER — Encounter (HOSPITAL_COMMUNITY): Payer: BC Managed Care – PPO

## 2012-12-10 ENCOUNTER — Encounter (HOSPITAL_COMMUNITY): Payer: BC Managed Care – PPO

## 2012-12-13 ENCOUNTER — Telehealth (HOSPITAL_COMMUNITY): Payer: Self-pay | Admitting: Cardiac Rehabilitation

## 2012-12-13 ENCOUNTER — Encounter (HOSPITAL_COMMUNITY): Payer: BC Managed Care – PPO

## 2012-12-13 NOTE — Telephone Encounter (Signed)
pc to assess reason for extended absence from cardiac rehab. Pt states he has had work conflicts.  Pt will consider attending a class time later in the day.  Pt states he has been working out at home on his own.

## 2012-12-15 ENCOUNTER — Encounter (HOSPITAL_COMMUNITY): Payer: BC Managed Care – PPO

## 2012-12-17 ENCOUNTER — Encounter (HOSPITAL_COMMUNITY): Payer: BC Managed Care – PPO

## 2012-12-20 ENCOUNTER — Encounter (HOSPITAL_COMMUNITY): Payer: BC Managed Care – PPO

## 2012-12-22 ENCOUNTER — Encounter (HOSPITAL_COMMUNITY): Payer: BC Managed Care – PPO

## 2012-12-24 ENCOUNTER — Encounter (HOSPITAL_COMMUNITY): Payer: BC Managed Care – PPO

## 2012-12-24 ENCOUNTER — Telehealth (HOSPITAL_COMMUNITY): Payer: Self-pay | Admitting: Cardiac Rehabilitation

## 2012-12-24 NOTE — Telephone Encounter (Signed)
pc to pt to advise his allowed time at cardiac rehab as ended. Lm on am.

## 2012-12-27 ENCOUNTER — Encounter (HOSPITAL_COMMUNITY): Payer: BC Managed Care – PPO

## 2012-12-29 ENCOUNTER — Encounter (HOSPITAL_COMMUNITY): Payer: BC Managed Care – PPO

## 2012-12-31 ENCOUNTER — Encounter (HOSPITAL_COMMUNITY): Payer: BC Managed Care – PPO

## 2013-11-24 IMAGING — CR DG CHEST 2V
2 series · 2 of 2 positions shown · non-contrast
Comparison: None.

CLINICAL DATA: Cough for several months

CHEST - 2 VIEW

[w chest pa]
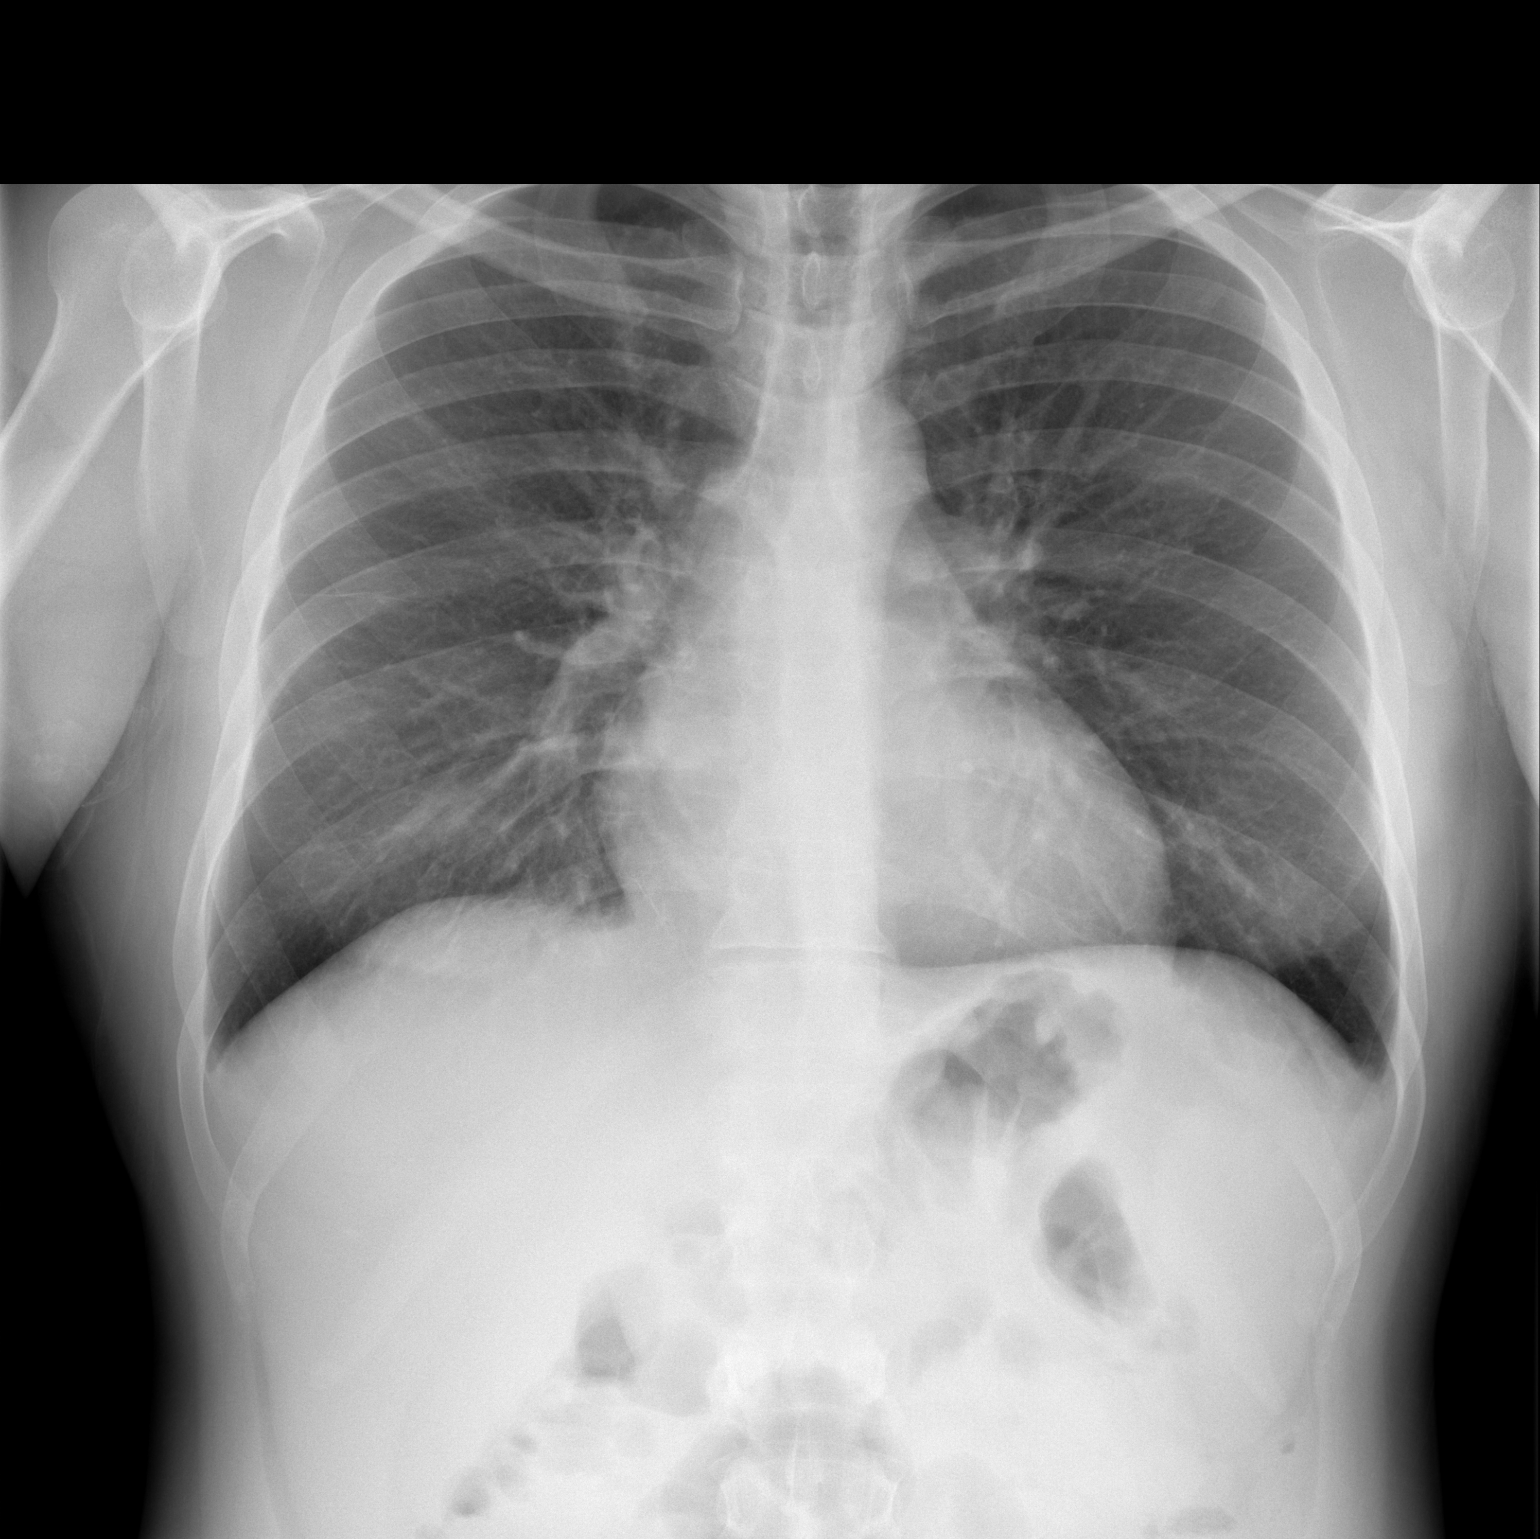

[w chest lat]
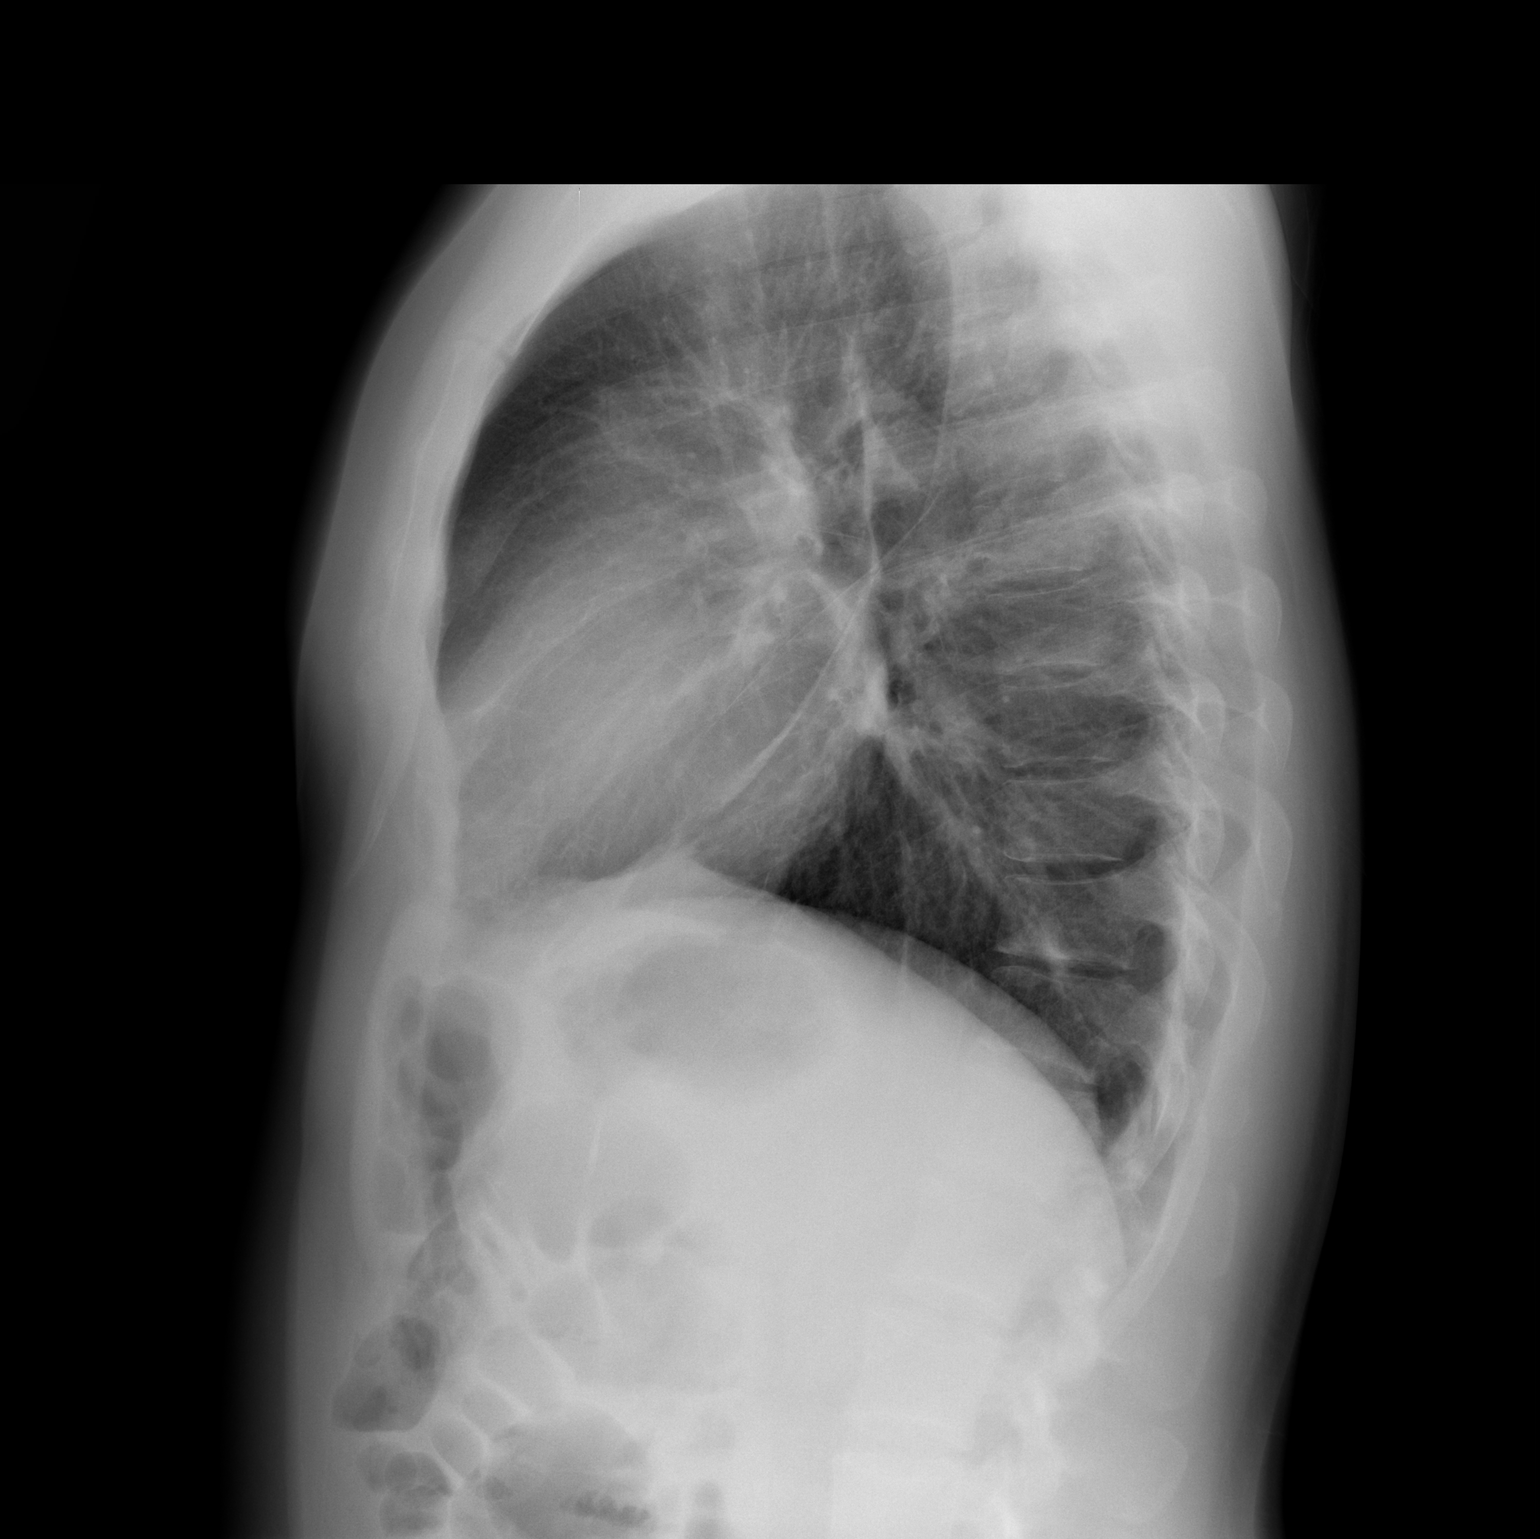

[2 of 2 positions shown; findings below may reference images not displayed]

FINDINGS: No active infiltrate or effusion is seen.  Mild
peribronchial thickening is noted.  Mediastinal contours appear
normal.  The heart is within upper limits of normal.  No bony
abnormality is seen.
IMPRESSION: Mild peribronchial thickening.  No active process.

## 2014-01-14 ENCOUNTER — Encounter: Payer: Self-pay | Admitting: Interventional Cardiology

## 2014-01-18 ENCOUNTER — Ambulatory Visit: Payer: BC Managed Care – PPO | Admitting: Interventional Cardiology

## 2014-01-30 ENCOUNTER — Telehealth: Payer: Self-pay | Admitting: Interventional Cardiology

## 2014-01-30 MED ORDER — AMOXICILLIN 500 MG PO CAPS: ORAL_CAPSULE | ORAL | Status: DC

## 2014-01-30 NOTE — Telephone Encounter (Signed)
Amoxicillin 2000 gram sent into pharm. LM on pts secure VM.

## 2014-01-30 NOTE — Telephone Encounter (Signed)
New message    Dentist appt @ 9 am on Wed . Need antibiotic prior visit.

## 2014-02-01 ENCOUNTER — Encounter: Payer: Self-pay | Admitting: Interventional Cardiology

## 2014-02-01 ENCOUNTER — Ambulatory Visit (INDEPENDENT_AMBULATORY_CARE_PROVIDER_SITE_OTHER): Payer: BC Managed Care – PPO | Admitting: Interventional Cardiology

## 2014-02-01 ENCOUNTER — Encounter (INDEPENDENT_AMBULATORY_CARE_PROVIDER_SITE_OTHER): Payer: Self-pay

## 2014-02-01 VITALS — BP 100/70 | HR 64 | Ht 66.0 in | Wt 186.0 lb

## 2014-02-01 DIAGNOSIS — I38 Endocarditis, valve unspecified: Secondary | ICD-10-CM

## 2014-02-01 DIAGNOSIS — I351 Nonrheumatic aortic (valve) insufficiency: Secondary | ICD-10-CM

## 2014-02-01 DIAGNOSIS — R0602 Shortness of breath: Secondary | ICD-10-CM

## 2014-02-01 DIAGNOSIS — I359 Nonrheumatic aortic valve disorder, unspecified: Secondary | ICD-10-CM

## 2014-02-01 MED ORDER — AMLODIPINE BESYLATE 5 MG PO TABS: 5.0000 mg | ORAL_TABLET | Freq: Every day | ORAL | Status: DC

## 2014-02-01 NOTE — Patient Instructions (Signed)
Your physician has recommended you make the following change in your medication:   1. Decrease amlodipine to 5 mg daily.  Your physician has requested that you regularly monitor and record your blood pressure readings at home. Please use the same machine at the same time of day to check your readings and record them. Call if you top number is consistently above 130.  Your physician wants you to follow-up in: 6 months with Dr. Eldridge DaceVaranasi. You will receive a reminder letter in the mail two months in advance. If you don't receive a letter, please call our office to schedule the follow-up appointment.

## 2014-02-01 NOTE — Progress Notes (Signed)
Patient ID: Austin Charles, male   DOB: 06-24-1981, 33 y.o.   MRN: [id number]    [address] [place], [place]  [id number] Phone: [phone] Fax:  [phone]  Date:  02/01/2014   ID:  Austin Charles, DOB 06-24-1981, MRN [id number]  PCP:  Default, Provider, MD      History of Present Illness: Austin Charles is a 33 y.o. male who had aortic valve endocarditis in August 2013. [facility], Ross procedure. He had a complicated stay including pneumonia and ICU stay. Now he is feeling well. He has gained some weight.  He reports some chest discomfort. It starts in his epigastric area and radiates to both sides of his chest. It is worse when he eats certain foods. It is not related to exertion. He is riding exercise bike for 30 minutes a day basically every day. He is not having the same type of cough that he had when he had his endocarditis.  No palpitations, syncope.   He was recently in [place] with his family. Unfortunately, his 35 year old nephew passed away. This has been a very stressful time for him.     Wt Readings from Last 3 Encounters:  02/01/14 186 lb (84.369 kg)  09/23/12 163 lb 2.3 oz (74 kg)  06/29/12 150 lb 12.7 oz (68.4 kg)     Past Medical History  Diagnosis Date  . 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 Heart murmur   . Aortic regurgitation 06/24/2012    severe/H&P  . Mitral regurgitation 06/24/2012    moderate/H&P  . Exertional dyspnea 06/24/2012  . Exposure to bat without known bite ~ 05/2012    "had full course of rabies shots" (06/24/2012)    Current Outpatient Prescriptions  Medication Sig Dispense Refill  . amLODipine (NORVASC) 5 MG tablet Take 10 mg by mouth daily.       Marland Kitchen. amoxicillin (AMOXIL) 500 MG capsule 4 CAPSULES 1 HR PRIOR TO DENTAL PROCEDURES/CLEANINGS  4 capsule  2  . aspirin 81 MG tablet Take 81 mg by mouth daily.      . metoprolol tartrate (LOPRESSOR) 25 MG tablet Take 25 mg by mouth 2 (two) times daily.       No current  facility-administered medications for this visit.    Allergies:   No Known Allergies  Social History:  The patient  reports that he quit smoking about 2 years ago. His smoking use included Cigarettes. He has a 3.5 pack-year smoking history. He has never used smokeless tobacco. He reports that he drinks about 1.2 ounces of alcohol per week. He reports that he does not use illicit drugs.   Family History:  The patient's family history includes Coronary artery disease in his other.   ROS:  Please see the history of present illness.  No nausea, vomiting.  No fevers, chills.  No focal weakness.  No dysuria.    All other systems reviewed and negative.   PHYSICAL EXAM: VS:  BP 100/70  Pulse 64  Ht 5\' 6"  (1.676 m)  Wt 186 lb (84.369 kg)  BMI 30.04 kg/m2 Well nourished, well developed, in no acute distress HEENT: normal Neck: no JVD, no carotid bruits Cardiac:  normal S1, S2; RRR; no significant murmurs Lungs:  clear to auscultation bilaterally, no wheezing, rhonchi or rales Abd: soft, nontender, no hepatomegaly Ext: no edema Skin: warm and dry Neuro:   no focal abnormalities noted  EKG:  NSR, RBBB     ASSESSMENT AND  PLAN:  Aortic regurgitation /Chest pain Notes: s/p Ross procedure. Murmur resolved. Trace AI by echocardiogram. Cough was his sign of fluid overload. No CHF sx. he does report some chest discomfort. It sounds like reflux. It is worse with certain foods. It is not related to exertion. He has had some relief with over-the-counter Zantac. I encouraged him to continue with this medication on a regular basis. If symptoms persist, he'll let us know.    2. 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 Mild shortness of breath with riding the exercise bike. He has gained some weight. I encouraged him to try to exercise regularly to improve his  exercise tolerance.    3. Benign essential hypertension  Notes: Decrease amlodipine to 5 mg daily.  Target BP < 130 systolic.  blood pressure was on the low end today. He may feel better with less medication. He needs to check his blood pressure outside the doctor's office.    4. Lipid screening  LAB: Lipid Panel w/reflex    Labs    Lab: Lipid Panel w/reflex  Cholesterol 191  <200 - mg/dL  TRIG 161  0-960 - mg/dL  HDLD 48  45-40 - mg/dL  Calc LDL 981 H 1-91 - mg/dL  Chol/HDL ratio 4.0  4.7-8.2 - Ratio   Londyn Wotton,JAY 07/22/2013 06:20:03 PM > well controlled. Stegall,Amy 07/26/2013 04:44:24 PM > lmom per dpr.         Preventive Medicine  Adult topics discussed:  Diet: low salt, healthy diet, Lots of fruits and vegetables.  Exercise: 5 days a week, at least 30 minutes of aerobic exercise.      Signed, Fredric Mare, MD, [facility] 02/01/2014 4:19 PM

## 2014-02-27 ENCOUNTER — Other Ambulatory Visit: Payer: Self-pay | Admitting: *Deleted

## 2014-02-27 MED ORDER — METOPROLOL TARTRATE 25 MG PO TABS: 25.0000 mg | ORAL_TABLET | Freq: Two times a day (BID) | ORAL | Status: DC

## 2014-02-27 MED ORDER — AMLODIPINE BESYLATE 5 MG PO TABS: 5.0000 mg | ORAL_TABLET | Freq: Every day | ORAL | Status: DC

## 2014-03-27 ENCOUNTER — Other Ambulatory Visit: Payer: Self-pay | Admitting: *Deleted

## 2014-03-27 MED ORDER — METOPROLOL TARTRATE 25 MG PO TABS: 25.0000 mg | ORAL_TABLET | Freq: Two times a day (BID) | ORAL | Status: DC

## 2014-04-20 ENCOUNTER — Other Ambulatory Visit: Payer: Self-pay

## 2014-06-23 ENCOUNTER — Encounter (HOSPITAL_COMMUNITY): Payer: Self-pay | Admitting: Emergency Medicine

## 2014-06-23 ENCOUNTER — Emergency Department (HOSPITAL_COMMUNITY): Payer: BC Managed Care – PPO

## 2014-06-23 ENCOUNTER — Emergency Department (HOSPITAL_COMMUNITY)
Admission: EM | Admit: 2014-06-23 | Discharge: 2014-06-23 | Disposition: A | Discharge status: Home / Self Care | Payer: BC Managed Care – PPO | Attending: Emergency Medicine | Admitting: Emergency Medicine

## 2014-06-23 DIAGNOSIS — Z7982 Long term (current) use of aspirin: Secondary | ICD-10-CM | POA: Insufficient documentation

## 2014-06-23 DIAGNOSIS — Z87891 Personal history of nicotine dependence: Secondary | ICD-10-CM | POA: Insufficient documentation

## 2014-06-23 DIAGNOSIS — R7401 Elevation of levels of liver transaminase levels: Secondary | ICD-10-CM | POA: Insufficient documentation

## 2014-06-23 DIAGNOSIS — Z792 Long term (current) use of antibiotics: Secondary | ICD-10-CM | POA: Insufficient documentation

## 2014-06-23 DIAGNOSIS — R7402 Elevation of levels of lactic acid dehydrogenase (LDH): Secondary | ICD-10-CM | POA: Insufficient documentation

## 2014-06-23 DIAGNOSIS — R740 Nonspecific elevation of levels of transaminase and lactic acid dehydrogenase [LDH]: Secondary | ICD-10-CM

## 2014-06-23 DIAGNOSIS — R1011 Right upper quadrant pain: Secondary | ICD-10-CM

## 2014-06-23 DIAGNOSIS — Z8679 Personal history of other diseases of the circulatory system: Secondary | ICD-10-CM | POA: Insufficient documentation

## 2014-06-23 DIAGNOSIS — R1084 Generalized abdominal pain: Secondary | ICD-10-CM | POA: Insufficient documentation

## 2014-06-23 DIAGNOSIS — R011 Cardiac murmur, unspecified: Secondary | ICD-10-CM | POA: Insufficient documentation

## 2014-06-23 LAB — COMPREHENSIVE METABOLIC PANEL
ALBUMIN: 4.3 g/dL (ref 3.5–5.2)
ALT: 109 U/L — ABNORMAL HIGH (ref 0–53)
ANION GAP: 15 (ref 5–15)
AST: 188 U/L — AB (ref 0–37)
Alkaline Phosphatase: 77 U/L (ref 39–117)
BILIRUBIN TOTAL: 0.7 mg/dL (ref 0.3–1.2)
BUN: 14 mg/dL (ref 6–23)
CALCIUM: 9.3 mg/dL (ref 8.4–10.5)
CHLORIDE: 98 meq/L (ref 96–112)
CO2: 27 mEq/L (ref 19–32)
CREATININE: 0.91 mg/dL (ref 0.50–1.35)
GFR calc Af Amer: >90 mL/min (ref >90)
GFR calc non Af Amer: >90 mL/min (ref >90)
Glucose, Bld: 95 mg/dL (ref 70–99)
Potassium: 4.4 mEq/L (ref 3.7–5.3)
Sodium: 140 mEq/L (ref 137–147)
TOTAL PROTEIN: 7.4 g/dL (ref 6.0–8.3)

## 2014-06-23 LAB — CBC WITH DIFFERENTIAL/PLATELET
BASOS ABS: 0 10*3/uL (ref 0.0–0.1)
BASOS PCT: 0 % (ref 0–1)
EOS ABS: 0.1 10*3/uL (ref 0.0–0.7)
EOS PCT: 1 % (ref 0–5)
HEMATOCRIT: 45.7 % (ref 39.0–52.0)
HEMOGLOBIN: 15.8 g/dL (ref 13.0–17.0)
Lymphocytes Relative: 25 % (ref 12–46)
Lymphs Abs: 2.3 10*3/uL (ref 0.7–4.0)
MCH: 29.8 pg (ref 26.0–34.0)
MCHC: 34.6 g/dL (ref 30.0–36.0)
MCV: 86.1 fL (ref 78.0–100.0)
MONO ABS: 0.5 10*3/uL (ref 0.1–1.0)
MONOS PCT: 6 % (ref 3–12)
Neutro Abs: 6.1 10*3/uL (ref 1.7–7.7)
Neutrophils Relative %: 68 % (ref 43–77)
Platelets: 260 10*3/uL (ref 150–400)
RBC: 5.31 MIL/uL (ref 4.22–5.81)
RDW: 12.6 % (ref 11.5–15.5)
WBC: 8.9 10*3/uL (ref 4.0–10.5)

## 2014-06-23 LAB — POC OCCULT BLOOD, ED: FECAL OCCULT BLD: NEGATIVE

## 2014-06-23 LAB — LIPASE, BLOOD: LIPASE: 61 U/L — AB (ref 11–59)

## 2014-06-23 MED ORDER — MORPHINE SULFATE 4 MG/ML IJ SOLN
4.0000 mg | Freq: Once | INTRAMUSCULAR | Status: AC
  Administered 2014-06-23: 4 mg via INTRAVENOUS
  Filled 2014-06-23: qty 1

## 2014-06-23 MED ORDER — HYDROCODONE-ACETAMINOPHEN 5-325 MG PO TABS: 2.0000 | ORAL_TABLET | ORAL | Status: AC | PRN

## 2014-06-23 MED ORDER — ONDANSETRON HCL 4 MG/2ML IJ SOLN: 4.0000 mg | Freq: Once | INTRAMUSCULAR | Status: DC

## 2014-06-23 NOTE — ED Provider Notes (Signed)
CSN: [id number]     Arrival date & time 06/23/14  6387 History   First MD Initiated Contact with Patient 06/23/14 [phone]     Chief Complaint  Patient presents with  . 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 Pain is aching and throbbing and woke him from sleep this morning. Pain was generalized and radiated to his back. He could not get comfortable. He took an antiacid which reduced the pain but it is persistent. This is a new pain and unlike acid reflux pain he has had before. Food does not aggravate his pain. Has nausea  And reported two black stools yesterday but no hematochezia, hemoptysis, hematuria, vomiting, diarrhea, constipation, urinary changes, CP, dyspnea, palpitations, peripheral edema, HA, weakness.  Past Medical History  Diagnosis Date  . 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 Heart murmur   . Aortic regurgitation 06/24/2012    severe/H&P  . Mitral regurgitation 06/24/2012    moderate/H&P  . Exertional dyspnea 06/24/2012  . Exposure to bat without known bite ~ 05/2012    "had full course of rabies shots" (06/24/2012)   Past Surgical History  Procedure Laterality Date  . Hernia repair  1980    "I think in his stomach"/father  . Tee without cardioversion  06/25/2012    Procedure: TRANSESOPHAGEAL ECHOCARDIOGRAM (TEE);  Surgeon: Corky Crafts, MD;  Location: [facility] ENDOSCOPY;  Service: Cardiovascular;  Laterality: N/A;   Family History  Problem Relation Age of Onset  . Coronary artery disease Other   . Heart attack Maternal Uncle    History  Substance Use Topics  . Smoking status: Former Smoker -- 0.50 packs/day for 7 years    Types: Cigarettes    Quit date: 05/25/2011  . Smokeless tobacco: Never Used  . Alcohol Use: 1.2 oz/week    1 Cans of beer, 1 Shots of liquor per week    Review of Systems  10 Systems reviewed and are negative for acute  change except as noted in the HPI.    Allergies  Review of patient's allergies indicates no known allergies.  Home Medications   Prior to Admission medications   Medication Sig Start Date End Date Taking? Authorizing Provider  amLODipine (NORVASC) 5 MG tablet Take 1 tablet (5 mg total) by mouth daily. 02/27/14  Yes Corky Crafts, MD  amoxicillin (AMOXIL) 500 MG capsule 4 CAPSULES 1 HR PRIOR TO DENTAL PROCEDURES/CLEANINGS 01/30/14  Yes Corky Crafts, MD  aspirin 81 MG tablet Take 81 mg by mouth daily.   Yes Historical Provider, MD  metoprolol tartrate (LOPRESSOR) 25 MG tablet Take 1 tablet (25 mg total) by mouth 2 (two) times daily. 03/27/14  Yes Corky Crafts, MD  HYDROcodone-acetaminophen (NORCO/VICODIN) 5-325 MG per tablet Take 2 tablets by mouth every 4 (four) hours as needed for moderate pain or severe pain. 06/23/14   Louann Sjogren, PA-C   BP 113/65  Pulse 66  Temp(Src) 97.4 F (36.3 C) (Oral)  Resp 18  Ht 5\' 6"  (1.676 m)  Wt 180 lb (81.647 kg)  BMI 29.07 kg/m2  SpO2 99% Physical Exam  Vitals reviewed. Constitutional: He is oriented to person, place, and time. He appears well-developed and well-nourished. No distress.  HENT:  Head: Normocephalic and atraumatic.  Eyes: Conjunctivae and EOM are normal. No scleral icterus.  Neck: Neck supple.  Cardiovascular: Normal rate, regular rhythm,  normal heart sounds and intact distal pulses.   Pulmonary/Chest: Effort normal and breath sounds normal. No respiratory distress. He has no wheezes.  Abdominal: Soft. Bowel sounds are normal. He exhibits no distension, no abdominal bruit and no mass. There is no hepatomegaly. There is tenderness in the right upper quadrant. There is no rigidity, no rebound, no guarding, no CVA tenderness and negative Murphy's sign.  Repeat abdominal exam: Decreased RUQ tenderness to palpation. +BS no guarding or rebound. No CVA tenderness or back pain.  Genitourinary: Rectum normal. Rectal exam  shows no tenderness. Guaiac negative stool.  RN was in the room during exam.  Musculoskeletal: He exhibits no edema and no tenderness.  Lymphadenopathy:    He has no cervical adenopathy.  Neurological: He is alert and oriented to person, place, and time. He exhibits normal muscle tone.  Skin: Skin is warm and dry. He is not diaphoretic.    ED Course  Procedures (including critical care time) Labs Review Labs Reviewed  COMPREHENSIVE METABOLIC PANEL - Abnormal; Notable for the following:    AST 188 (*)    ALT 109 (*)    All other components within normal limits  LIPASE, BLOOD - Abnormal; Notable for the following:    Lipase 61 (*)    All other components within normal limits  CBC WITH DIFFERENTIAL  POC OCCULT BLOOD, ED    Imaging Review Koreas Abdomen Limited Ruq  06/23/2014   CLINICAL DATA:  Right upper quadrant pain.  EXAM: US ABDOMEN LIMITED - RIGHT UPPER QUADRANT  COMPARISON:  None.  FINDINGS: Gallbladder:  No gallstones or wall thickening visualized. No sonographic Murphy sign noted.  Common bile duct:  Diameter: 0.3 cm  Liver:  No focal lesion identified. Within normal limits in parenchymal echogenicity.  IMPRESSION: Negative for gallstones.  Negative exam   Electronically Signed   By: Drusilla Kannerhomas  Dalessio M.D.   On: 06/23/2014 10:31     EKG Interpretation None      MDM   Final diagnoses:  RUQ abdominal pain  Elevated transaminase level   Patient with RUQ pain with acute onset. Patient is afebrile, nontoxic and in no distress. Pain improved with pain meds. Patient had negative occult blood but moderately elevated AST ALT and mildly elevated lipase. RUQ ultrasound unremarkable. Doubt internal bleeding or acute emergent abdominal condition.  Stressed the importance of follow up with primary care provider to evaluate the elevated LFTs and evaluate for hepatitis.   Discussed return precautions with patient. Discussed all results and patient verbalizes understanding and agrees with  plan.      Louann SjogrenVictoria L Aprile Dickenson, PA-C 06/23/14 [phone]

## 2014-06-23 NOTE — ED Notes (Signed)
Pt woke up this morning with abd pain 7/10. Upper abd pain that wraps around his back. Pt denies vomiting/diarrhea but he does feel nauseous.

## 2014-06-23 NOTE — Discharge Instructions (Signed)
Return to the emergency room if your symptoms worsen or you have symptoms that are concerning for you. Follow up with your PCP due to the Elevated liver enzymes. [facility] to retrieve your records. You must sign a form. Do not operate machinery or drive while taking narcotics.  Biliary Colic  Biliary colic is a steady or irregular pain in the upper abdomen. It is usually under the right side of the rib cage. It happens when gallstones interfere with the normal flow of bile from the gallbladder. Bile is a liquid that helps to digest fats. Bile is made in the liver and stored in the gallbladder. When you eat a meal, bile passes from the gallbladder through the cystic duct and the common bile duct into the small intestine. There, it mixes with partially digested food. If a gallstone blocks either of these ducts, the normal flow of bile is blocked. The muscle cells in the bile duct contract forcefully to try to move the stone. This causes the pain of biliary colic.  SYMPTOMS   A person with biliary colic usually complains of pain in the upper abdomen. This pain can be:  In the center of the upper abdomen just below the breastbone.  In the upper-right part of the abdomen, near the gallbladder and liver.  Spread back toward the right shoulder blade.  Nausea and vomiting.  The pain usually occurs after eating.  Biliary colic is usually triggered by the digestive system's demand for bile. The demand for bile is high after fatty meals. Symptoms can also occur when a person who has been fasting suddenly eats a very large meal. Most episodes of biliary colic pass after 1 to 5 hours. After the most intense pain passes, your abdomen may continue to ache mildly for about 24 hours. DIAGNOSIS  After you describe your symptoms, your caregiver will perform a physical exam. He or she will pay attention to the upper right portion of your belly (abdomen). This is the area of your  liver and gallbladder. An ultrasound will help your caregiver look for gallstones. Specialized scans of the gallbladder may also be done. Blood tests may be done, especially if you have fever or if your pain persists. PREVENTION  Biliary colic can be prevented by controlling the risk factors for gallstones. Some of these risk factors, such as heredity, increasing age, and pregnancy are a normal part of life. Obesity and a high-fat diet are risk factors you can change through a healthy lifestyle. Women going through menopause who take hormone replacement therapy (estrogen) are also more likely to develop biliary colic. TREATMENT   Pain medication may be prescribed.  You may be encouraged to eat a fat-free diet.  If the first episode of biliary colic is severe, or episodes of colic keep retuning, surgery to remove the gallbladder (cholecystectomy) is usually recommended. This procedure can be done through small incisions using an instrument called a laparoscope. The procedure often requires a brief stay in the hospital. Some people can leave the hospital the same day. It is the most widely used treatment in people troubled by painful gallstones. It is effective and safe, with no complications in more than 90% of cases.  If surgery cannot be done, medication that dissolves gallstones may be used. This medication is expensive and can take months or years to work. Only small stones will dissolve.  Rarely, medication to dissolve gallstones is combined with a procedure called shock-wave lithotripsy. This procedure uses  carefully aimed shock waves to break up gallstones. In many people treated with this procedure, gallstones form again within a few years. PROGNOSIS  If gallstones block your cystic duct or common bile duct, you are at risk for repeated episodes of biliary colic. There is also a 25% chance that you will develop a gallbladder infection(acute cholecystitis), or some other complication of  gallstones within 10 to 20 years. If you have surgery, schedule it at a time that is convenient for you and at a time when you are not sick. HOME CARE INSTRUCTIONS   Drink plenty of clear fluids.  Avoid fatty, greasy or fried foods, or any foods that make your pain worse.  Take medications as directed. SEEK MEDICAL CARE IF:   You develop a fever over 100.5 F (38.1 C).  Your pain gets worse over time.  You develop nausea that prevents you from eating and drinking.  You develop vomiting. SEEK IMMEDIATE MEDICAL CARE IF:   You have continuous or severe belly (abdominal) pain which is not relieved with medications.  You develop nausea and vomiting which is not relieved with medications.  You have symptoms of biliary colic and you suddenly develop a fever and shaking chills. This may signal cholecystitis. Call your caregiver immediately.  You develop a yellow color to your skin or the white part of your eyes (jaundice). Document Released: 04/13/2006 Document Revised: 02/02/2012 Document Reviewed: 06/22/2008 [facility] Patient Information 2015 [place], [place]. This information is not intended to replace advice given to you by your health care provider. Make sure you discuss any questions you have with your health care provider.  Abdominal Pain Many things can cause abdominal pain. Usually, abdominal pain is not caused by a disease and will improve without treatment. It can often be observed and treated at home. Your health care provider will do a physical exam and possibly order blood tests and X-rays to help determine the seriousness of your pain. However, in many cases, more time must pass before a clear cause of the pain can be found. Before that point, your health care provider may not know if you need more testing or further treatment. HOME CARE INSTRUCTIONS  Monitor your abdominal pain for any changes. The following actions may help to alleviate any discomfort you are  experiencing:  Only take over-the-counter or prescription medicines as directed by your health care provider.  Do not take laxatives unless directed to do so by your health care provider.  Try a clear liquid diet (broth, tea, or water) as directed by your health care provider. Slowly move to a bland diet as tolerated. SEEK MEDICAL CARE IF:  You have unexplained abdominal pain.  You have abdominal pain associated with nausea or diarrhea.  You have pain when you urinate or have a bowel movement.  You experience abdominal pain that wakes you in the night.  You have abdominal pain that is worsened or improved by eating food.  You have abdominal pain that is worsened with eating fatty foods.  You have a fever. SEEK IMMEDIATE MEDICAL CARE IF:   Your pain does not go away within 2 hours.  You keep throwing up (vomiting).  Your pain is felt only in portions of the abdomen, such as the right side or the left lower portion of the abdomen.  You pass bloody or black tarry stools. MAKE SURE YOU:  Understand these instructions.   Will watch your condition.   Will get help right away if you are not doing  well or get worse.  Document Released: 08/20/2005 Document Revised: 11/15/2013 Document Reviewed: 07/20/2013 [facility] Patient Information 2015 [place], [place]. This information is not intended to replace advice given to you by your health care provider. Make sure you discuss any questions you have with your health care provider.

## 2014-06-24 NOTE — ED Provider Notes (Signed)
Medical screening examination/treatment/procedure(s) were conducted as a shared visit with non-physician practitioner(s) and myself.  I personally evaluated the patient during the encounter.   EKG Interpretation None      I interviewed and examined the patient. Lungs are CTAB. Cardiac exam wnl. Abdomen soft, no ttp on my exam now, but was having ttp of RUQ. Mildly elev lft's but no elev in wbc. Labs/imaging otherwise interpreted and non-contrib. Pt well appearing. No longer in pain. Will rec f/u w/ pcp and return for any worsening.   Junius ArgyleForrest S Rohen Kimes, MD 06/24/14 [phone]

## 2014-11-05 ENCOUNTER — Other Ambulatory Visit: Payer: Self-pay | Admitting: Interventional Cardiology

## 2014-12-14 ENCOUNTER — Other Ambulatory Visit: Payer: Self-pay | Admitting: Interventional Cardiology

## 2015-01-31 ENCOUNTER — Other Ambulatory Visit: Payer: Self-pay | Admitting: *Deleted

## 2015-01-31 MED ORDER — AMLODIPINE BESYLATE 5 MG PO TABS: ORAL_TABLET | ORAL | Status: DC

## 2015-01-31 MED ORDER — METOPROLOL TARTRATE 25 MG PO TABS: 25.0000 mg | ORAL_TABLET | Freq: Two times a day (BID) | ORAL | Status: DC

## 2015-03-10 ENCOUNTER — Other Ambulatory Visit: Payer: Self-pay | Admitting: Interventional Cardiology

## 2015-03-20 ENCOUNTER — Other Ambulatory Visit: Payer: Self-pay

## 2015-03-20 MED ORDER — AMLODIPINE BESYLATE 5 MG PO TABS: ORAL_TABLET | ORAL | Status: DC

## 2015-03-20 MED ORDER — METOPROLOL TARTRATE 25 MG PO TABS: ORAL_TABLET | ORAL | Status: DC

## 2015-04-16 ENCOUNTER — Ambulatory Visit (INDEPENDENT_AMBULATORY_CARE_PROVIDER_SITE_OTHER): Payer: BLUE CROSS/BLUE SHIELD | Admitting: Interventional Cardiology

## 2015-04-16 ENCOUNTER — Encounter: Payer: Self-pay | Admitting: Interventional Cardiology

## 2015-04-16 VITALS — BP 116/90 | HR 74 | Ht 66.0 in | Wt 184.0 lb

## 2015-04-16 DIAGNOSIS — R030 Elevated blood-pressure reading, without diagnosis of hypertension: Secondary | ICD-10-CM | POA: Insufficient documentation

## 2015-04-16 DIAGNOSIS — I4510 Unspecified right bundle-branch block: Secondary | ICD-10-CM | POA: Insufficient documentation

## 2015-04-16 DIAGNOSIS — I38 Endocarditis, valve unspecified: Secondary | ICD-10-CM

## 2015-04-16 DIAGNOSIS — I351 Nonrheumatic aortic (valve) insufficiency: Secondary | ICD-10-CM

## 2015-04-16 MED ORDER — AMOXICILLIN 500 MG PO CAPS: ORAL_CAPSULE | ORAL | Status: DC

## 2015-04-16 NOTE — Patient Instructions (Addendum)
Medication Instructions:  Stop taking Metoprolol  Labwork: None  Testing/Procedures: None  Follow-Up: Your physician wants you to follow-up in: 1 year. You will receive a reminder letter in the mail two months in advance. If you don't receive a letter, please call our office to schedule the follow-up appointment.   Any Other Special Instructions Will Be Listed Below (If Applicable). Your physician has requested that you regularly monitor and record your blood pressure readings at home. Please call the office at [phone] to report blood pressure greater than 130/80 sustained.

## 2015-04-16 NOTE — Progress Notes (Signed)
Patient ID: Austin Charles, male   DOB: 28-Jan-1981, 34 y.o.   MRN: [id number]     Cardiology Office Note   Date:  04/16/2015   ID:  Benedict Kue, DOB Mar 26, 1981, MRN [id number]  PCP:  REDMON,NOELLE, PA-C    No chief complaint on file. AV replacement   Wt Readings from Last 3 Encounters:  04/16/15 184 lb (83.462 kg)  06/23/14 180 lb (81.647 kg)  02/01/14 186 lb (84.369 kg)       History of Present Illness: Austin Charles is a 34 y.o. 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 He had surgery at [facility] procedure. He had a complicated stay including pneumonia and ICU stay. Now he is feeling well. He has gained some weight.  He reports some side discomfort a few months ago. It starts in his epigastric area and radiates to both sides of his abdomen. It is worse when he eats certain foods. It is not related to exertion.  He had elevated LFTs.  LFTs improved.  He isswimming for 30 minutes a day basically every day. No palpitations, syncope.  He forgets his medicines sometimes.  He stopped aspirin.       Past Medical History  Diagnosis Date  . 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 Heart murmur   . Aortic regurgitation 06/24/2012    severe/H&P  . Mitral regurgitation 06/24/2012    moderate/H&P  . Exertional dyspnea 06/24/2012  . Exposure to bat without known bite ~ 05/2012    "had full course of rabies shots" (06/24/2012)    Past Surgical History  Procedure Laterality Date  . Hernia repair  1980    "I think in his stomach"/father  . Tee without cardioversion  06/25/2012    Procedure: TRANSESOPHAGEAL ECHOCARDIOGRAM (TEE);  Surgeon: Corky Crafts, MD;  Location: [facility] ENDOSCOPY;  Service: Cardiovascular;  Laterality: N/A;     Current Outpatient Prescriptions  Medication Sig Dispense Refill  . amLODipine (NORVASC) 5 MG tablet TAKE 1 TABLET BY MOUTH EVERY DAY 30 tablet 0  . amoxicillin (AMOXIL) 500 MG capsule 4 CAPSULES 1 HR PRIOR TO DENTAL PROCEDURES/CLEANINGS 4  capsule 2  . aspirin 81 MG tablet Take 81 mg by mouth daily.    Marland Kitchen HYDROcodone-acetaminophen (NORCO/VICODIN) 5-325 MG per tablet Take 2 tablets by mouth every 4 (four) hours as needed for moderate pain or severe pain. 10 tablet 0   No current facility-administered medications for this visit.    Allergies:   Review of patient's allergies indicates no known allergies.    Social History:  The patient  reports that he quit smoking about 3 years ago. His smoking use included Cigarettes. He has a 3.5 pack-year smoking history. He has never used smokeless tobacco. He reports that he drinks about 1.2 oz of alcohol per week. He reports that he does not use illicit drugs.   Family History:  The patient's *family history includes Coronary artery disease in his other; Heart attack in his maternal uncle.    ROS:  Please see the history of present illness.   Otherwise, review of systems are positive for stressin life.   All other systems are reviewed and negative.    PHYSICAL EXAM: VS:  BP 116/90 mmHg  Pulse 74  Ht  (1.676 m)  Wt 184 lb (83.462 kg)  BMI 29.71 kg/m2 , BMI Body mass index is 29.71 kg/(m^2). GEN: Well nourished, well developed, in no acute distress HEENT: normal Neck: no JVD, carotid bruits, or  masses Cardiac: RRR; no murmurs, rubs, or gallops,no edema  Respiratory:  clear to auscultation bilaterally, normal work of breathing GI: soft, nontender, nondistended, + BS MS: no deformity or atrophy Skin: warm and dry, no rash Neuro:  Strength and sensation are intact Psych: euthymic mood, full affect   EKG:   The ekg ordered today demonstrates NSR, RBBB; no change from prior   Recent Labs: 06/23/2014: ALT 109*; BUN 14; Creatinine 0.91; Hemoglobin 15.8; Platelets 260; Potassium 4.4; Sodium 140   Lipid Panel No results found for: CHOL, TRIG, HDL, CHOLHDL, VLDL, LDLCALC, LDLDIRECT   Other studies Reviewed: Additional studies/ records that were reviewed today with results  demonstrating: negative hepatitis serologies.   ASSESSMENT AND PLAN:  1. *Aortic regurgitation /Chest pain Notes: s/p Ross procedure. Murmur resolved. Trace AI by echocardiogram. Cough was his sign of fluid overload.  Amlodipine started for AI 2. *Heart valve replaced by other means /  Start Amoxicillin Capsule, 500 MG, 4 capsule, Orally, one hour prior to dental treatment, 4, Refills 2 Notes: Needs SBE prophylaxis for life. 3. Elevated blood pressure without diagnosis of hypertension: Stop metoprolol.  Check BP at home. He will let us know if there are any readings greater than 130/80. Continue amlodipine for now. If his blood pressure stays low, we could consider stopping this medicine in the future. Diastolic blood pressure borderline today. He has not been taking his amlodipine or metoprolol 100% of the time. He forgot both medicines yesterday. The last dose of medicine he took was a metoprolol dose over 24 hours ago.  Continue lifestyle changes. 4. LFTS better per his report.   Current medicines are reviewed at length with the patient today.  The patient concerns regarding his medicines were addressed.  The following changes have been made:  Stop metoprolol  Labs/ tests ordered today include: none  Orders Placed This Encounter  Procedures  . EKG 12-Lead    Recommend 150 minutes/week of aerobic exercise Low fat, low carb, high fiber diet recommended  Disposition:   FU in 1 year; check BP at home   Signed, Corky CraftsVARANASI,Viktorya Arguijo S., MD  04/16/2015 10:33 AM    [facility] [address], [place], [place]  [address] Phone: [phone]; Fax: [phone]

## 2015-04-24 ENCOUNTER — Other Ambulatory Visit: Payer: Self-pay | Admitting: Interventional Cardiology

## 2015-10-25 ENCOUNTER — Ambulatory Visit
Admission: RE | Admit: 2015-10-25 | Discharge: 2015-10-25 | Disposition: A | Discharge status: Home / Self Care | Payer: BLUE CROSS/BLUE SHIELD | Source: Ambulatory Visit | Attending: Physician Assistant | Admitting: Physician Assistant

## 2015-10-25 ENCOUNTER — Telehealth: Payer: Self-pay | Admitting: Interventional Cardiology

## 2015-10-25 ENCOUNTER — Other Ambulatory Visit: Payer: Self-pay | Admitting: Physician Assistant

## 2015-10-25 DIAGNOSIS — R05 Cough: Secondary | ICD-10-CM

## 2015-10-25 DIAGNOSIS — R0602 Shortness of breath: Secondary | ICD-10-CM

## 2015-10-25 DIAGNOSIS — R059 Cough, unspecified: Secondary | ICD-10-CM

## 2015-10-25 DIAGNOSIS — R509 Fever, unspecified: Secondary | ICD-10-CM

## 2015-10-25 NOTE — Telephone Encounter (Signed)
**Note De-Identified Austin Charles Obfuscation** The pt is advised to monitor his BP at home BID for 1 week and to call back with his readings so Dr Eldridge DaceVaranasi will have a better understanding of what his BPs are. He verbalized understanding and is in agreement with plan.

## 2015-10-25 NOTE — Telephone Encounter (Signed)
New Message    Pt calling stating that he wants to know if he needs to go back on the Metoprolol because his Bp is between 120/-- and 130/--. Please call back and advise.

## 2016-05-19 ENCOUNTER — Other Ambulatory Visit: Payer: Self-pay | Admitting: Interventional Cardiology

## 2016-06-25 ENCOUNTER — Telehealth: Payer: Self-pay | Admitting: Interventional Cardiology

## 2016-06-25 NOTE — Telephone Encounter (Signed)
New message     Pt c/o BP issue: STAT if pt c/o blurred vision, one-sided weakness or slurred speech  1. What are your last 5 BP readings? 140/90, last night145/88  2. Are you having any other symptoms (ex. Dizziness, headache, blurred vision, passed out)?no  3. What is your BP issue? The patient has had congestion and the pt just decided to monitor b/p and wanted the nurse be aware, he going to primary to be checked out     The pt is out and if you need to reach him call back on Friday

## 2016-06-25 NOTE — Telephone Encounter (Signed)
**Note De-Identified Austin Charles Obfuscation** Noted. Will forward message to Dr Eldridge Dace as Lorain Childes.

## 2016-07-09 ENCOUNTER — Ambulatory Visit (INDEPENDENT_AMBULATORY_CARE_PROVIDER_SITE_OTHER): Payer: BLUE CROSS/BLUE SHIELD | Admitting: Interventional Cardiology

## 2016-07-09 ENCOUNTER — Encounter (INDEPENDENT_AMBULATORY_CARE_PROVIDER_SITE_OTHER): Payer: Self-pay

## 2016-07-09 ENCOUNTER — Encounter: Payer: Self-pay | Admitting: Interventional Cardiology

## 2016-07-09 VITALS — BP 114/72 | HR 73 | Ht 66.0 in | Wt 177.8 lb

## 2016-07-09 DIAGNOSIS — I10 Essential (primary) hypertension: Secondary | ICD-10-CM

## 2016-07-09 DIAGNOSIS — I351 Nonrheumatic aortic (valve) insufficiency: Secondary | ICD-10-CM | POA: Diagnosis not present

## 2016-07-09 DIAGNOSIS — Z954 Presence of other heart-valve replacement: Secondary | ICD-10-CM

## 2016-07-09 DIAGNOSIS — Z952 Presence of prosthetic heart valve: Secondary | ICD-10-CM | POA: Insufficient documentation

## 2016-07-09 NOTE — Patient Instructions (Signed)
Medication Instructions:  Same-no changes  Labwork: None  Testing/Procedures: Your physician has requested that you have an echocardiogram. Echocardiography is a painless test that uses sound waves to create images of your heart. It provides your doctor with information about the size and shape of your heart and how well your heart's chambers and valves are working. This procedure takes approximately one hour. There are no restrictions for this procedure.   Follow-Up: Your physician wants you to follow-up in: 1 year. You will receive a reminder letter in the mail two months in advance. If you don't receive a letter, please call our office to schedule the follow-up appointment.     If you need a refill on your cardiac medications before your next appointment, please call your pharmacy.   

## 2016-07-09 NOTE — Progress Notes (Signed)
Cardiology Office Note   Date:  07/09/2016   ID:  Austin Charles, DOB 1981-10-17, MRN [id number]  PCP:  REDMON,NOELLE, PA-C    No chief complaint on file.  Prior AV replacement  Wt Readings from Last 3 Encounters:  07/09/16 177 lb 12.8 oz (80.6 kg)  04/16/15 184 lb (83.5 kg)  06/23/14 180 lb (81.6 kg)       History of Present Illness: Austin Charles is a 35 y.o. 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 He had [facility], Ross procedure. He had a complicated stay including pneumonia and ICU stay. Now he is feeling well. He has gained some weight.  In the past,  He had elevated LFTs.  LFTs improved.  He is getting back to swimming for 30 minutes a day several times a week. No palpitations, syncope.  BP had been high.  Was on antibiotics for congestion.  BP better now.  Tolerating amlodipine.  He had been on metoprolol in the past.      Past Medical History:  Diagnosis Date  . Aortic regurgitation 06/24/2012   severe/H&P  . 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 Exertional dyspnea 06/24/2012  . Exposure to bat without known bite ~ 05/2012   "had full course of rabies shots" (06/24/2012)  . Heart murmur   . Mitral regurgitation 06/24/2012   moderate/H&P    Past Surgical History:  Procedure Laterality Date  . HERNIA REPAIR  1980   "I think in his stomach"/father  . TEE WITHOUT CARDIOVERSION  06/25/2012   Procedure: TRANSESOPHAGEAL ECHOCARDIOGRAM (TEE);  Surgeon: Corky CraftsJayadeep S. Ketina Mars, MD;  [facility];  Service: Cardiovascular;  Laterality: N/A;     Current Outpatient Prescriptions  Medication Sig Dispense Refill  . amLODipine (NORVASC) 5 MG tablet TAKE 1 TABLET BY MOUTH EVERY DAY 30 tablet 5  . amoxicillin (AMOXIL) 500 MG capsule 4 CAPSULES 1 HR PRIOR TO DENTAL PROCEDURES/CLEANINGS 4 capsule 2  . aspirin 81 MG tablet Take 81 mg by mouth daily.    Marland Kitchen. HYDROcodone-acetaminophen (NORCO/VICODIN) 5-325 MG per tablet Take 2 tablets by mouth every  4 (four) hours as needed for moderate pain or severe pain. 10 tablet 0   No current facility-administered medications for this visit.     Allergies:   Review of patient's allergies indicates no known allergies.    Social History:  The patient  reports that he quit smoking about 5 years ago. His smoking use included Cigarettes. He has a 3.50 pack-year smoking history. He has never used smokeless tobacco. He reports that he drinks about 1.2 oz of alcohol per week . He reports that he does not use drugs.   Family History:  The patient's family history includes Coronary artery disease in his other; Heart attack in his maternal uncle.    ROS:  Please see the history of present illness.   Otherwise, review of systems are positive for .   All other systems are reviewed and negative.    PHYSICAL EXAM: VS:  BP 114/72   Pulse 73   Ht 5\' 6"  (1.676 m)   Wt 177 lb 12.8 oz (80.6 kg)   BMI 28.70 kg/m  , BMI Body mass index is 28.7 kg/m. GEN: Well nourished, well developed, in no acute distress  HEENT: normal  Neck: no JVD, carotid bruits, or masses Cardiac: RRR; no murmurs, rubs, or gallops,no edema  Respiratory:  clear to auscultation bilaterally, normal work of breathing GI: soft, nontender, nondistended, +  BS MS: no deformity or atrophy  Skin: warm and dry, no rash Neuro:  Strength and sensation are intact Psych: euthymic mood, full affect   EKG:   The ekg ordered today demonstrates NSR, RBBB   Recent Labs: No results found for requested labs within last 8760 hours.   Lipid Panel No results found for: CHOL, TRIG, HDL, CHOLHDL, VLDL, LDLCALC, LDLDIRECT   Other studies Reviewed: Additional studies/ records that were reviewed today with results demonstrating: 12/2012 echo.   ASSESSMENT AND PLAN:  1. S/p Ross procedure:  Continue SBE prophylaxis.  Will check echocardiogram since his been 3-1/2 years. He had a bicuspid valve with significant aortic insufficiency, endocarditis which  was later treated with a Ross procedure in 2013. 2. HTN: Controlled on amlodipine.  COntinue exercise.  3. Stressed importance of dental health.    Current medicines are reviewed at length with the patient today.  The patient concerns regarding his medicines were addressed.  The following changes have been made:  No change  Labs/ tests ordered today include: No orders of the defined types were placed in this encounter.   Recommend 150 minutes/week of aerobic exercise Low fat, low carb, high fiber diet recommended  Disposition:   FU in 1 year   Signed, Lance MussJayadeep Tyren Dugar, MD  07/09/2016 10:05 AM    [facility] [address], [place], [place]  [address] Phone: [phone]) [phone]; Fax: [phone]) [phone]

## 2016-07-22 ENCOUNTER — Other Ambulatory Visit: Payer: Self-pay

## 2016-07-22 ENCOUNTER — Ambulatory Visit (HOSPITAL_COMMUNITY): Payer: BLUE CROSS/BLUE SHIELD | Attending: Cardiology

## 2016-07-22 ENCOUNTER — Encounter (INDEPENDENT_AMBULATORY_CARE_PROVIDER_SITE_OTHER): Payer: Self-pay

## 2016-07-22 DIAGNOSIS — I77810 Thoracic aortic ectasia: Secondary | ICD-10-CM | POA: Insufficient documentation

## 2016-07-22 DIAGNOSIS — I371 Nonrheumatic pulmonary valve insufficiency: Secondary | ICD-10-CM | POA: Diagnosis not present

## 2016-07-22 DIAGNOSIS — I5189 Other ill-defined heart diseases: Secondary | ICD-10-CM | POA: Insufficient documentation

## 2016-07-22 DIAGNOSIS — I071 Rheumatic tricuspid insufficiency: Secondary | ICD-10-CM | POA: Diagnosis not present

## 2016-07-22 DIAGNOSIS — I351 Nonrheumatic aortic (valve) insufficiency: Secondary | ICD-10-CM | POA: Insufficient documentation

## 2016-07-22 DIAGNOSIS — I359 Nonrheumatic aortic valve disorder, unspecified: Secondary | ICD-10-CM | POA: Diagnosis present

## 2016-07-29 ENCOUNTER — Other Ambulatory Visit: Payer: Self-pay | Admitting: Interventional Cardiology

## 2016-07-29 NOTE — Telephone Encounter (Signed)
Ok to fill. Thanks

## 2016-08-22 ENCOUNTER — Ambulatory Visit
Admission: RE | Admit: 2016-08-22 | Discharge: 2016-08-22 | Disposition: A | Discharge status: Home / Self Care | Payer: BLUE CROSS/BLUE SHIELD | Source: Ambulatory Visit | Attending: Physician Assistant | Admitting: Physician Assistant

## 2016-08-22 ENCOUNTER — Other Ambulatory Visit: Payer: Self-pay | Admitting: Physician Assistant

## 2016-08-22 DIAGNOSIS — R059 Cough, unspecified: Secondary | ICD-10-CM

## 2016-08-22 DIAGNOSIS — R05 Cough: Secondary | ICD-10-CM

## 2017-01-04 ENCOUNTER — Other Ambulatory Visit: Payer: Self-pay | Admitting: Interventional Cardiology

## 2017-06-30 ENCOUNTER — Other Ambulatory Visit: Payer: Self-pay | Admitting: Interventional Cardiology

## 2017-07-24 ENCOUNTER — Other Ambulatory Visit: Payer: Self-pay | Admitting: Interventional Cardiology

## 2017-08-31 NOTE — Progress Notes (Signed)
Cardiology Office Note   Date:  09/01/2017   ID:  Austin Charles, DOB 1981-05-17, MRN [id number]  PCP:  Milus Height, PA-C    No chief complaint on file. AVR   Wt Readings from Last 3 Encounters:  09/01/17 185 lb (83.9 kg)  07/09/16 177 lb 12.8 oz (80.6 kg)  04/16/15 184 lb (83.5 kg)       History of Present Illness: Austin Charles is a 36 y.o. 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 He had surgery at [facility] procedure. He had a complicated stay including pneumonia and ICU stay.  In the past, He had elevated LFTs. LFTs later improved.   Tolerating amlodipine.  He had been on metoprolol in the past.    BP controlled when checked.  He has gained some weight.  Denies : Chest pain. Dizziness. Leg edema. Nitroglycerin use. Orthopnea. Palpitations. Paroxysmal nocturnal dyspnea. Shortness of breath. Syncope.   He exercises  Occasionally.  He swims and runs.      Past Medical History:  Diagnosis Date  . Aortic regurgitation 06/24/2012   severe/H&P  . 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 Exertional dyspnea 06/24/2012  . Exposure to bat without known bite ~ 05/2012   "had full course of rabies shots" (06/24/2012)  . Heart murmur   . Mitral regurgitation 06/24/2012   moderate/H&P    Past Surgical History:  Procedure Laterality Date  . HERNIA REPAIR  1980   "I think in his stomach"/father  . TEE WITHOUT CARDIOVERSION  06/25/2012   Procedure: TRANSESOPHAGEAL ECHOCARDIOGRAM (TEE);  Surgeon: Corky Crafts, MD;  Location: [facility] ENDOSCOPY;  Service: Cardiovascular;  Laterality: N/A;     Current Outpatient Prescriptions  Medication Sig Dispense Refill  . amLODipine (NORVASC) 5 MG tablet TAKE 1 TABLET BY MOUTH EVERY DAY 30 tablet 2  . amoxicillin (AMOXIL) 500 MG capsule 4 CAPSULES 1 HR PRIOR TO DENTAL PROCEDURES/CLEANINGS 4 capsule 0  . aspirin 81 MG tablet Take 81 mg by mouth daily.    Marland Kitchen HYDROcodone-acetaminophen (NORCO/VICODIN) 5-325 MG per tablet  Take 2 tablets by mouth every 4 (four) hours as needed for moderate pain or severe pain. 10 tablet 0   No current facility-administered medications for this visit.     Allergies:   Patient has no known allergies.    Social History:  The patient  reports that he quit smoking about 6 years ago. His smoking use included Cigarettes. He has a 3.50 pack-year smoking history. He has never used smokeless tobacco. He reports that he drinks about 1.2 oz of alcohol per week . He reports that he does not use drugs.   Family History:  The patient's family history includes Coronary artery disease in his other; Heart attack in his maternal uncle.    ROS:  Please see the history of present illness.   Otherwise, review of systems are positive for weight gain.   All other systems are reviewed and negative.    PHYSICAL EXAM: VS:  BP 120/88   Pulse 69   Ht  (1.676 m)   Wt 185 lb (83.9 kg)   SpO2 97%   BMI 29.86 kg/m  , BMI Body mass index is 29.86 kg/m. GEN: Well nourished, well developed, in no acute distress  HEENT: normal  Neck: no JVD, carotid bruits, or masses Cardiac: RRR; soft systolic murmur; no rubs, or gallops,no edema  Respiratory:  clear to auscultation bilaterally, normal work of breathing GI: soft, nontender, nondistended, +  BS MS: no deformity or atrophy  Skin: warm and dry, no rash Neuro:  Strength and sensation are intact Psych: euthymic mood, full affect   EKG:   The ekg ordered today demonstrates NSR, RBBB, no ST changes   Recent Labs: No results found for requested labs within last 8760 hours.   Lipid Panel No results found for: CHOL, TRIG, HDL, CHOLHDL, VLDL, LDLCALC, LDLDIRECT   Other studies Reviewed: Additional studies/ records that were reviewed today with results demonstrating: 2017 echo reviewed.  Normal valvular function.   ASSESSMENT AND PLAN:  1. S/p Ross procedure:  Doing well.  No CHF sx.  EF normal on 2017 echo.   2. HTN: Blood pressure control.  Continue current medicine. He should check his blood pressure on occasion outside of the doctor's office. Continue healthy diet and regular activity.    Current medicines are reviewed at length with the patient today.  The patient concerns regarding his medicines were addressed.  The following changes have been made:  No change  Labs/ tests ordered today include:  No orders of the defined types were placed in this encounter.   Recommend 150 minutes/week of aerobic exercise Low fat, low carb, high fiber diet recommended  Disposition:   FU in 1 year   Signed, Lance Muss, MD  09/01/2017 10:33 AM    [facility] [address] [place], [place], [place]  [address] Phone: [phone]; Fax: [phone]

## 2017-09-01 ENCOUNTER — Other Ambulatory Visit: Payer: Self-pay

## 2017-09-01 ENCOUNTER — Encounter: Payer: Self-pay | Admitting: Interventional Cardiology

## 2017-09-01 ENCOUNTER — Ambulatory Visit (INDEPENDENT_AMBULATORY_CARE_PROVIDER_SITE_OTHER): Payer: BLUE CROSS/BLUE SHIELD | Admitting: Interventional Cardiology

## 2017-09-01 VITALS — BP 120/88 | HR 69 | Ht 66.0 in | Wt 185.0 lb

## 2017-09-01 DIAGNOSIS — I10 Essential (primary) hypertension: Secondary | ICD-10-CM

## 2017-09-01 DIAGNOSIS — Z952 Presence of prosthetic heart valve: Secondary | ICD-10-CM | POA: Diagnosis not present

## 2017-09-01 DIAGNOSIS — E785 Hyperlipidemia, unspecified: Secondary | ICD-10-CM

## 2017-09-01 LAB — COMPREHENSIVE METABOLIC PANEL
ALT: 59 IU/L — AB (ref 0–44)
AST: 31 IU/L (ref 0–40)
Albumin/Globulin Ratio: 1.9 (ref 1.2–2.2)
Albumin: 4.7 g/dL (ref 3.5–5.5)
Alkaline Phosphatase: 68 IU/L (ref 39–117)
BUN/Creatinine Ratio: 15 (ref 9–20)
BUN: 13 mg/dL (ref 6–20)
Bilirubin Total: 0.4 mg/dL (ref 0.0–1.2)
CALCIUM: 9.6 mg/dL (ref 8.7–10.2)
CO2: 23 mmol/L (ref 20–29)
CREATININE: 0.89 mg/dL (ref 0.76–1.27)
Chloride: 99 mmol/L (ref 96–106)
GFR, EST AFRICAN AMERICAN: 125 mL/min/{1.73_m2} (ref >59)
GFR, EST NON AFRICAN AMERICAN: 108 mL/min/{1.73_m2} (ref >59)
GLUCOSE: 88 mg/dL (ref 65–99)
Globulin, Total: 2.5 g/dL (ref 1.5–4.5)
Potassium: 4.7 mmol/L (ref 3.5–5.2)
Sodium: 138 mmol/L (ref 134–144)
TOTAL PROTEIN: 7.2 g/dL (ref 6.0–8.5)

## 2017-09-01 LAB — LIPID PANEL
CHOL/HDL RATIO: 5.7 ratio — AB (ref 0.0–5.0)
CHOLESTEROL TOTAL: 229 mg/dL — AB (ref 100–199)
HDL: 40 mg/dL (ref >39)
LDL CALC: 142 mg/dL — AB (ref 0–99)
TRIGLYCERIDES: 236 mg/dL — AB (ref 0–149)
VLDL CHOLESTEROL CAL: 47 mg/dL — AB (ref 5–40)

## 2017-09-01 NOTE — Patient Instructions (Signed)
Medication Instructions:  Your physician recommends that you continue on your current medications as directed. Please refer to the Current Medication list given to you today.   Labwork: CMET and LIPIDS TODAY  Testing/Procedures: None ordered  Follow-Up: Your physician wants you to follow-up in: 1 year with Dr. Eldridge Dace. You will receive a reminder letter in the mail two months in advance. If you don't receive a letter, please call our office to schedule the follow-up appointment.   Any Other Special Instructions Will Be Listed Below (If Applicable).     If you need a refill on your cardiac medications before your next appointment, please call your pharmacy.

## 2017-11-30 ENCOUNTER — Other Ambulatory Visit: Payer: BLUE CROSS/BLUE SHIELD | Admitting: *Deleted

## 2017-11-30 DIAGNOSIS — E785 Hyperlipidemia, unspecified: Secondary | ICD-10-CM

## 2017-12-01 ENCOUNTER — Telehealth: Payer: Self-pay

## 2017-12-01 DIAGNOSIS — E785 Hyperlipidemia, unspecified: Secondary | ICD-10-CM

## 2017-12-01 LAB — LIPID PANEL
CHOL/HDL RATIO: 5.6 ratio — AB (ref 0.0–5.0)
Cholesterol, Total: 222 mg/dL — ABNORMAL HIGH (ref 100–199)
HDL: 40 mg/dL (ref >39)
LDL CALC: 137 mg/dL — AB (ref 0–99)
TRIGLYCERIDES: 226 mg/dL — AB (ref 0–149)
VLDL CHOLESTEROL CAL: 45 mg/dL — AB (ref 5–40)

## 2017-12-01 MED ORDER — ROSUVASTATIN CALCIUM 10 MG PO TABS: ORAL_TABLET | ORAL | 0 refills | Status: DC

## 2017-12-01 NOTE — Telephone Encounter (Signed)
Called and made patient aware of results and recommendations to start crestor 10 mg ONCE a week and to recheck labs in 3-4 months. Patient verbalizes understanding and is in agreement. Fasting lab appointment made for 03/24/18 and Rx sent to patient's preferred pharmacy.

## 2017-12-01 NOTE — Telephone Encounter (Signed)
-----   Message from Corky CraftsJayadeep S Varanasi, MD sent at 12/01/2017  1:24 PM EST ----- Lipids still elevated.  Minimal change in lipids.  I would start Crestor 10 mg once a week.  Recheck lipids in 3-4 months. Will uptitrate dose based on results. OK to call in 30 pills since we don't know the final dose that will be required.

## 2017-12-10 ENCOUNTER — Other Ambulatory Visit: Payer: Self-pay | Admitting: Interventional Cardiology

## 2018-03-24 ENCOUNTER — Other Ambulatory Visit: Payer: BLUE CROSS/BLUE SHIELD

## 2018-04-06 ENCOUNTER — Other Ambulatory Visit: Payer: Self-pay | Admitting: Interventional Cardiology

## 2018-04-06 NOTE — Telephone Encounter (Signed)
Patient s/p AVR and requires SBE prophylaxis. Okay to refill.

## 2018-04-06 NOTE — Telephone Encounter (Signed)
Okay to refill? Please advise. Thanks, MI 

## 2018-05-11 ENCOUNTER — Ambulatory Visit
Admission: RE | Admit: 2018-05-11 | Discharge: 2018-05-11 | Disposition: A | Discharge status: Home / Self Care | Payer: BLUE CROSS/BLUE SHIELD | Source: Ambulatory Visit | Attending: Physician Assistant | Admitting: Physician Assistant

## 2018-05-11 ENCOUNTER — Other Ambulatory Visit: Payer: Self-pay | Admitting: Physician Assistant

## 2018-05-11 DIAGNOSIS — R52 Pain, unspecified: Secondary | ICD-10-CM

## 2018-06-05 ENCOUNTER — Other Ambulatory Visit: Payer: Self-pay | Admitting: Interventional Cardiology

## 2018-09-11 ENCOUNTER — Other Ambulatory Visit: Payer: Self-pay | Admitting: Interventional Cardiology

## 2018-09-15 ENCOUNTER — Other Ambulatory Visit: Payer: Self-pay | Admitting: Interventional Cardiology

## 2020-04-02 ENCOUNTER — Encounter: Payer: Self-pay | Admitting: General Practice
